# Patient Record
Sex: Female | Born: 1940
Health system: Southern US, Community
[De-identification: ages and names within clinical notes are randomized; demographics above are authoritative.]

## PROBLEM LIST (undated history)

## (undated) DIAGNOSIS — C801 Malignant (primary) neoplasm, unspecified: Secondary | ICD-10-CM

## (undated) DIAGNOSIS — E785 Hyperlipidemia, unspecified: Secondary | ICD-10-CM

## (undated) DIAGNOSIS — J45909 Unspecified asthma, uncomplicated: Secondary | ICD-10-CM

## (undated) DIAGNOSIS — H3581 Retinal edema: Secondary | ICD-10-CM

## (undated) DIAGNOSIS — K649 Unspecified hemorrhoids: Secondary | ICD-10-CM

## (undated) DIAGNOSIS — C50919 Malignant neoplasm of unspecified site of unspecified female breast: Secondary | ICD-10-CM

## (undated) DIAGNOSIS — M1990 Unspecified osteoarthritis, unspecified site: Secondary | ICD-10-CM

## (undated) DIAGNOSIS — R0600 Dyspnea, unspecified: Secondary | ICD-10-CM

## (undated) DIAGNOSIS — I2510 Atherosclerotic heart disease of native coronary artery without angina pectoris: Secondary | ICD-10-CM

## (undated) DIAGNOSIS — I10 Essential (primary) hypertension: Secondary | ICD-10-CM

## (undated) DIAGNOSIS — IMO0001 Reserved for inherently not codable concepts without codable children: Secondary | ICD-10-CM

## (undated) DIAGNOSIS — Z923 Personal history of irradiation: Secondary | ICD-10-CM

## (undated) HISTORY — PX: OTHER SURGICAL HISTORY: SHX169

## (undated) HISTORY — DX: Malignant (primary) neoplasm, unspecified: C80.1

## (undated) HISTORY — DX: Unspecified asthma, uncomplicated: J45.909

## (undated) HISTORY — DX: Atherosclerotic heart disease of native coronary artery without angina pectoris: I25.10

## (undated) HISTORY — DX: Essential (primary) hypertension: I10

## (undated) HISTORY — PX: BREAST LUMPECTOMY: SHX2

## (undated) HISTORY — DX: Unspecified hemorrhoids: K64.9

## (undated) HISTORY — PX: TONSILLECTOMY AND ADENOIDECTOMY: SUR1326

## (undated) HISTORY — DX: Hyperlipidemia, unspecified: E78.5

---

## 2000-01-20 ENCOUNTER — Encounter: Admission: RE | Admit: 2000-01-20 | Discharge: 2000-01-20 | Payer: Self-pay | Admitting: *Deleted

## 2000-01-20 ENCOUNTER — Encounter: Payer: Self-pay | Admitting: *Deleted

## 2000-12-13 ENCOUNTER — Encounter: Admission: RE | Admit: 2000-12-13 | Discharge: 2000-12-13 | Payer: Self-pay | Admitting: *Deleted

## 2000-12-13 ENCOUNTER — Encounter: Payer: Self-pay | Admitting: *Deleted

## 2001-03-22 ENCOUNTER — Other Ambulatory Visit: Admission: RE | Admit: 2001-03-22 | Discharge: 2001-03-22 | Payer: Self-pay | Admitting: *Deleted

## 2001-03-23 ENCOUNTER — Encounter: Payer: Self-pay | Admitting: *Deleted

## 2001-03-23 ENCOUNTER — Encounter: Admission: RE | Admit: 2001-03-23 | Discharge: 2001-03-23 | Payer: Self-pay | Admitting: *Deleted

## 2001-07-24 ENCOUNTER — Encounter: Payer: Self-pay | Admitting: *Deleted

## 2001-07-24 ENCOUNTER — Encounter: Admission: RE | Admit: 2001-07-24 | Discharge: 2001-07-24 | Payer: Self-pay | Admitting: *Deleted

## 2002-12-31 ENCOUNTER — Encounter: Admission: RE | Admit: 2002-12-31 | Discharge: 2002-12-31 | Payer: Self-pay

## 2003-05-01 ENCOUNTER — Encounter: Admission: RE | Admit: 2003-05-01 | Discharge: 2003-05-01 | Payer: Self-pay

## 2003-10-15 ENCOUNTER — Other Ambulatory Visit: Admission: RE | Admit: 2003-10-15 | Discharge: 2003-10-15 | Payer: Self-pay | Admitting: Family Medicine

## 2003-10-30 ENCOUNTER — Other Ambulatory Visit: Admission: RE | Admit: 2003-10-30 | Discharge: 2003-10-30 | Payer: Self-pay | Admitting: Family Medicine

## 2004-01-15 ENCOUNTER — Ambulatory Visit (HOSPITAL_COMMUNITY): Admission: RE | Admit: 2004-01-15 | Discharge: 2004-01-15 | Payer: Self-pay | Admitting: Gastroenterology

## 2004-05-25 ENCOUNTER — Encounter: Admission: RE | Admit: 2004-05-25 | Discharge: 2004-05-25 | Payer: Self-pay | Admitting: Family Medicine

## 2004-07-21 ENCOUNTER — Encounter: Admission: RE | Admit: 2004-07-21 | Discharge: 2004-07-21 | Payer: Self-pay | Admitting: Family Medicine

## 2005-08-22 ENCOUNTER — Encounter: Admission: RE | Admit: 2005-08-22 | Discharge: 2005-08-22 | Payer: Self-pay | Admitting: Family Medicine

## 2006-09-29 ENCOUNTER — Encounter: Admission: RE | Admit: 2006-09-29 | Discharge: 2006-09-29 | Payer: Self-pay | Admitting: Family Medicine

## 2007-07-26 DIAGNOSIS — C801 Malignant (primary) neoplasm, unspecified: Secondary | ICD-10-CM

## 2007-07-26 HISTORY — DX: Malignant (primary) neoplasm, unspecified: C80.1

## 2007-10-22 ENCOUNTER — Encounter: Admission: RE | Admit: 2007-10-22 | Discharge: 2007-10-22 | Payer: Self-pay | Admitting: Family Medicine

## 2007-11-13 ENCOUNTER — Encounter (INDEPENDENT_AMBULATORY_CARE_PROVIDER_SITE_OTHER): Payer: Self-pay | Admitting: Diagnostic Radiology

## 2007-11-13 ENCOUNTER — Encounter: Admission: RE | Admit: 2007-11-13 | Discharge: 2007-11-13 | Payer: Self-pay | Admitting: Family Medicine

## 2007-11-22 ENCOUNTER — Encounter: Admission: RE | Admit: 2007-11-22 | Discharge: 2007-11-22 | Payer: Self-pay | Admitting: Family Medicine

## 2007-12-06 ENCOUNTER — Encounter: Admission: RE | Admit: 2007-12-06 | Discharge: 2007-12-06 | Payer: Self-pay | Admitting: General Surgery

## 2007-12-14 ENCOUNTER — Encounter: Admission: RE | Admit: 2007-12-14 | Discharge: 2007-12-14 | Payer: Self-pay | Admitting: General Surgery

## 2007-12-18 ENCOUNTER — Encounter (INDEPENDENT_AMBULATORY_CARE_PROVIDER_SITE_OTHER): Payer: Self-pay | Admitting: General Surgery

## 2007-12-18 ENCOUNTER — Encounter: Admission: RE | Admit: 2007-12-18 | Discharge: 2007-12-18 | Payer: Self-pay | Admitting: General Surgery

## 2007-12-18 ENCOUNTER — Ambulatory Visit (HOSPITAL_BASED_OUTPATIENT_CLINIC_OR_DEPARTMENT_OTHER): Admission: RE | Admit: 2007-12-18 | Discharge: 2007-12-18 | Payer: Self-pay | Admitting: General Surgery

## 2007-12-20 ENCOUNTER — Ambulatory Visit: Payer: Self-pay | Admitting: Oncology

## 2007-12-26 ENCOUNTER — Ambulatory Visit: Admission: RE | Admit: 2007-12-26 | Discharge: 2008-03-25 | Payer: Self-pay | Admitting: Radiation Oncology

## 2008-02-04 ENCOUNTER — Encounter: Admission: RE | Admit: 2008-02-04 | Discharge: 2008-02-04 | Payer: Self-pay | Admitting: Radiation Oncology

## 2008-03-26 ENCOUNTER — Ambulatory Visit: Admission: RE | Admit: 2008-03-26 | Discharge: 2008-04-14 | Payer: Self-pay | Admitting: Radiation Oncology

## 2008-07-28 IMAGING — MG MM BREAST WIRE LOCALIZATION*L*
4 series · 4 of 4 positions shown · non-contrast
Comparison: none

MAMMO BREAST SURGICAL SPECIMEN

BREAST WIRE LOCALIZATION *L*
LEFT NEEDLE LOCALIZATION AND SPECIMEN RADIOGRAPH (DAY [HOSPITAL]):
CLINICAL DATA: The patient presents for needle localization prior to excision of a recently 
diagnosed carcinoma in the left breast.

[L LM (1 of 2)]
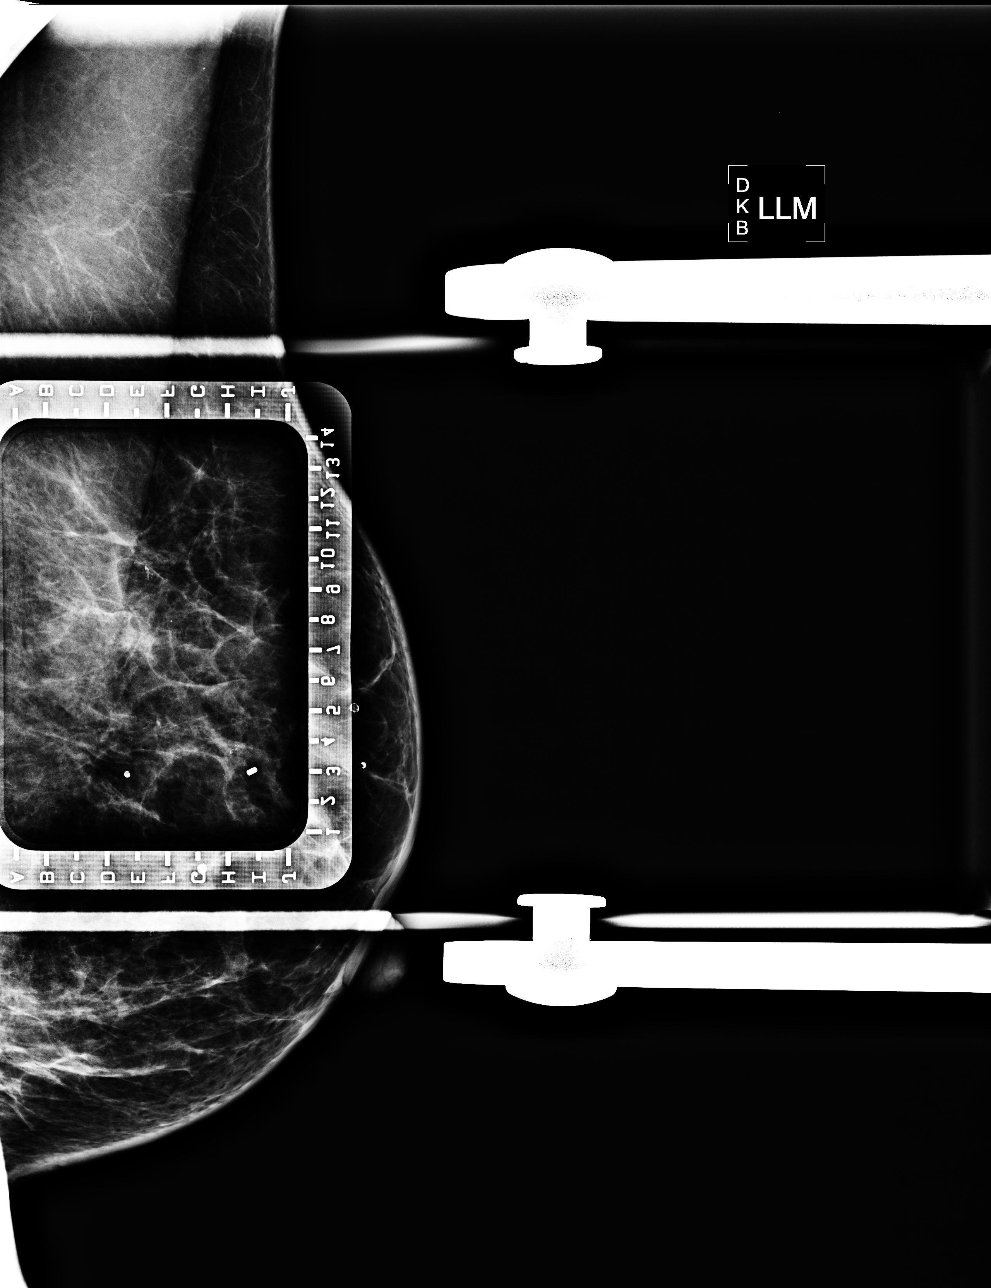

[L LM (2 of 2)]
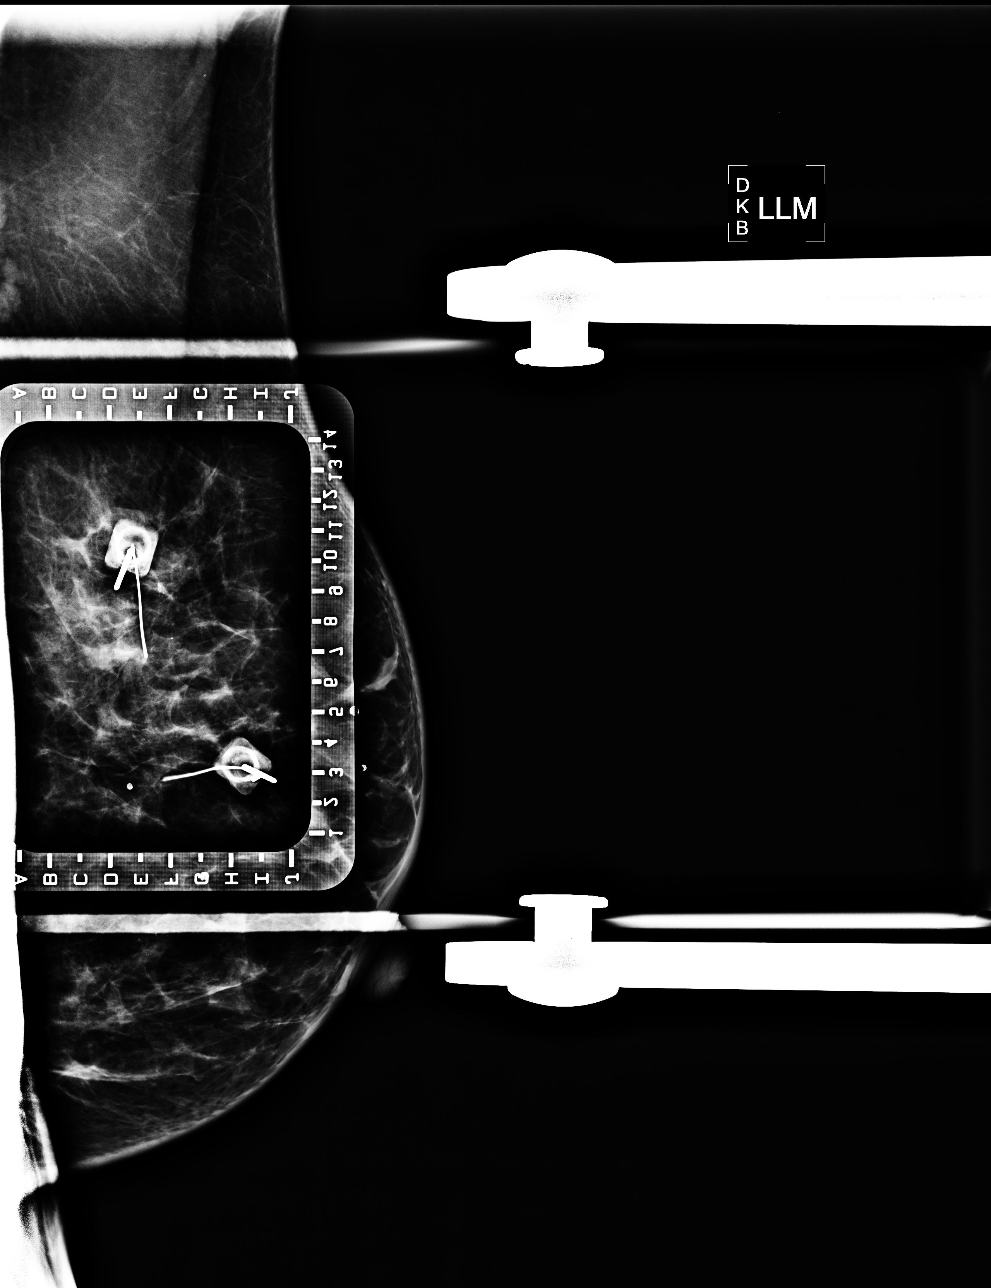

[L CC (1 of 2)]
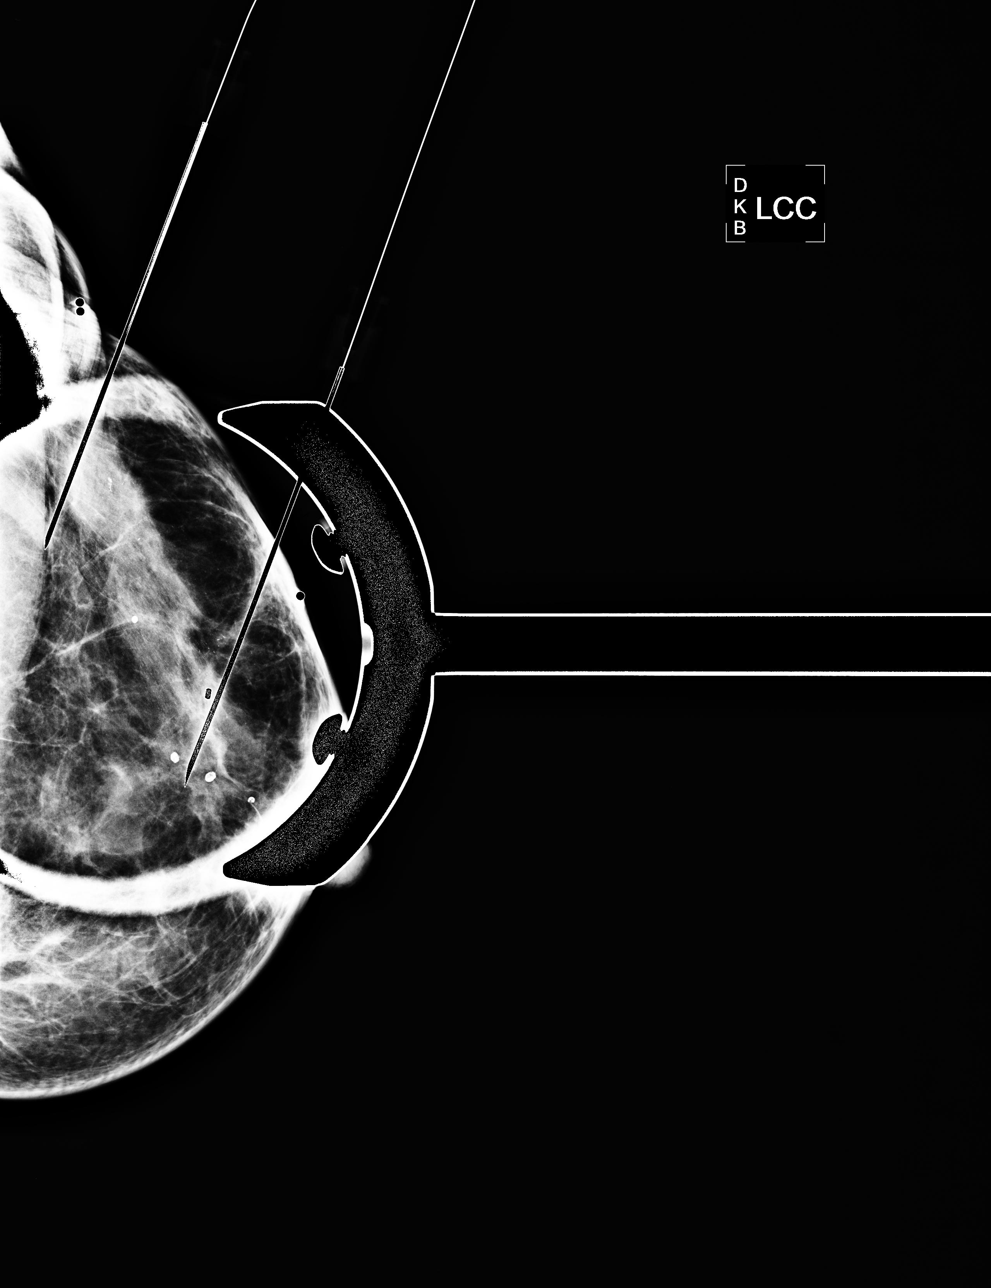

[L CC (2 of 2)]
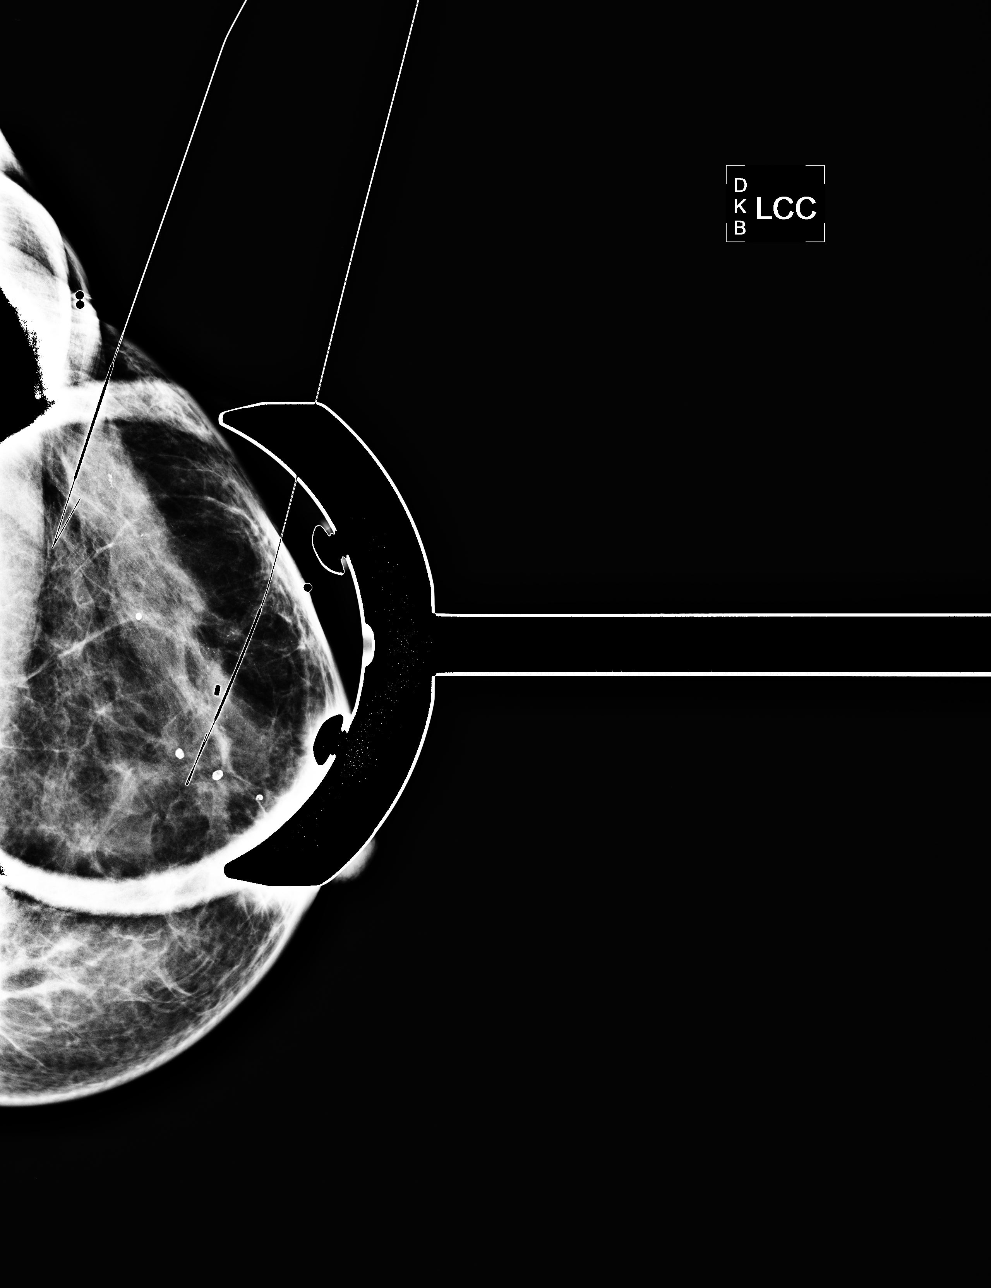

[4 of 4 positions shown; findings below may reference images not displayed]

The procedure for needle localization was discussed with the patient along with its risks, 
benefits, alternatives, and possible outcomes.  The need for rebiopsy should results prove 
inconclusive or precancerous was discussed.  The patient gave written informed consent for the 
procedure.

The patient's left breast was placed within the grad localization device in a true lateral and 
medial projection.  Using sterile technique and 2% Xylocaine for local anesthesia, two 7 cm 
modified Kopans needles were placed in the left upper outer quadrant, one at the anterior margin 
and one at the posterior margin.  Orthogonal views demonstrate appropriate positioning of the 
needle-wire assembly.  The wires were extruded and the needles removed without difficulty.  Films 
were labeled and sent with the patient to surgery.  She tolerated the procedure well.

The specimen was obtained and radiographed by the Day [HOSPITAL] staff.  The clip and both 
wires are within the specimen.  The calcifications of concern are along one border of the specimen.
In particular, one cluster is adjacent to the margin of the specimen.  This was discussed with 
the surgeon in the operating room.  The location of the clip and calcifications was marked by the 
nurse in the operating room.
IMPRESSION: Localization and excision of calcifications and a clip marking a prior biopsy site from the left 
upper outer quadrant.

,

## 2008-10-28 ENCOUNTER — Encounter: Admission: RE | Admit: 2008-10-28 | Discharge: 2008-10-28 | Payer: Self-pay | Admitting: General Surgery

## 2009-01-09 ENCOUNTER — Ambulatory Visit: Payer: Self-pay | Admitting: Oncology

## 2009-06-26 ENCOUNTER — Ambulatory Visit: Payer: Self-pay | Admitting: Oncology

## 2009-10-29 ENCOUNTER — Encounter: Admission: RE | Admit: 2009-10-29 | Discharge: 2009-10-29 | Payer: Self-pay | Admitting: Family Medicine

## 2010-02-24 ENCOUNTER — Encounter: Admission: RE | Admit: 2010-02-24 | Discharge: 2010-02-24 | Payer: Self-pay | Admitting: General Surgery

## 2010-07-01 ENCOUNTER — Ambulatory Visit: Payer: Self-pay | Admitting: Oncology

## 2010-07-01 DIAGNOSIS — C50011 Malignant neoplasm of nipple and areola, right female breast: Secondary | ICD-10-CM

## 2010-08-14 ENCOUNTER — Other Ambulatory Visit: Payer: Self-pay | Admitting: Oncology

## 2010-08-14 DIAGNOSIS — Z1231 Encounter for screening mammogram for malignant neoplasm of breast: Secondary | ICD-10-CM

## 2010-08-15 ENCOUNTER — Encounter: Payer: Self-pay | Admitting: General Surgery

## 2010-11-01 ENCOUNTER — Ambulatory Visit
Admission: RE | Admit: 2010-11-01 | Discharge: 2010-11-01 | Disposition: A | Discharge status: Home / Self Care | Payer: Medicare Other | Source: Ambulatory Visit | Attending: Oncology | Admitting: Oncology

## 2010-11-01 DIAGNOSIS — Z1231 Encounter for screening mammogram for malignant neoplasm of breast: Secondary | ICD-10-CM

## 2010-12-07 NOTE — Op Note (Signed)
NAME:  Tammy Holland, Tammy Holland               ACCOUNT NO.:  [id number]   MEDICAL RECORD NO.:  [id number]          PATIENT TYPE:  AMB   LOCATION:  DSC                          FACILITY:  [facility]   PHYSICIAN:  Angelia Mould. Derrell Lolling, M.D.DATE OF BIRTH:  10/19/1940   DATE OF PROCEDURE:  12/18/2007  DATE OF DISCHARGE:                               OPERATIVE REPORT   PREOPERATIVE DIAGNOSIS:  Ductal carcinoma in situ, left breast, question  of microinvasion.   POSTOPERATIVE DIAGNOSIS:  Ductal carcinoma in situ, left breast,  question of microinvasion.   OPERATION PERFORMED:  1. Injected blue dye, left breast.  2. Left partial mastectomy with needle localization, specimen,      mammogram.  3. Left axillary sentinel node biopsy.   SURGEON:  Angelia Mould. Derrell Lolling, MD   OPERATIVE INDICATIONS:  This is a 70 year old white female who has never  had a breast problem in the past.  Recent imaging study showed some  focal calcifications in the left breast in the upper outer quadrant.  Image-guided biopsy showed ductal carcinoma in situ with a question of a  focus of invasion.  Hormone receptors were reportedly negative.  Her MRI  suggested a second area of enhancement in the left upper outer quadrant  or axillary tail which required followup ultrasound but there was no  sonographic finding that was felt to be low risk.  The patient desired  breast conservation.  She underwent needle localization this morning  with 2 wires bracketing the area of microcalcifications.  She was  brought to [facility] electively.   OPERATIVE TECHNIQUE:  The patient underwent bracketed wire localization  of microcalcifications by Dr. Cain Saupe at the [facility]  [place].  She was brought to [facility].  Nuclear  medicine technician injected technetium sulfur colloid in the  retroareolar area.  The patient was brought to the operating room.  Following the alcohol prep, I injected 5 mL of blue dye in the  left  retroareolar area.  This was 2 mL of methylene blue mixed with 3 mL of  saline.  The breast was massaged for 4 minutes.  The left chest wall,  breast, and left axilla were then prepped and draped in sterile fashion.  Intravenous antibiotics were given.  The patient was identified as to  correct patient, correct procedure, and correct site.   Marcaine 0.5% with epinephrine was used as local infiltration  anesthetic.  The 2 wires were seen in the upper outer quadrant of the  left breast, one inserted medially and one inserted laterally but both  were directed medially and slightly inferiorly toward the retroareolar  area.  I made a curved incision in between the 2 wires that in general,  parallel to the areola.  I dissected around both wires very thoroughly  all the way down to the pectoralis fascia.  The patient's breasts were  actually quite small and so very quickly we got down to the pectoralis  fascia, which was dissected fre with the specimen.  I marked the  specimen with ink markers.  Specimen mammogram was obtained.  Dr. Deboraha Sprang  and I looked at the films and thought there might be a more peripherally  located microcalcification at the anterior-superior margin.  I went back  to the breast and re-excised the anterior and superior margin and  labeled that with ink markers as well and discussed this with Dr. Jimmy Picket.  Hemostasis was excellent and achieved with electrocautery.  The wound was irrigated with saline.  The subcutaneous tissue of the  breast was closed with interrupted sutures of 3-0 Vicryl and the skin  closed with running subcuticular suture of 4-0 Monocryl.   I then directed my attention to the left axilla.  There was a clearly  hot area using the NeoProbe.  A transverse incision was made just above  the hairline.  Dissection was carried down into the axilla.  There were  no palpable abnormalities.  I found a very hot and very blue lymph node  and I took that  out and imprint of cytology on that was completely  negative.  I found another lymph node that was somewhat warm but did not  have any blue dye.  I sent that as well.  Dr. Jimmy Picket said he  examined that as well and felt that it might be a rare atypical cell but  nothing he could call cancer.  Given the fact that this was mostly  ductal carcinoma in situ and it was uncertain, I felt that no further  axillary dissection should be performed at this time.  Dr. Luisa Hart  agreed. The axillary wound was irrigated with saline.  Hemostasis was  excellent and achieved with electrocautery.  The subcutaneous tissue was  closed with interrupted sutures of 3-0 Vicryl and skin closed with  running subcuticular suture of 4-0 Monocryl and Steri-Strips.  Clean  bandages were placed and the patient taken to recovery room in stable  condition.  Estimated blood loss was about 20 mL.  Complications none.  Sponge, needle, and instrument counts were correct.      Angelia Mould. Derrell Lolling, M.D.  Electronically Signed     HMI/MEDQ  D:  12/18/2007  T:  12/19/2007  Job:  [id number]   cc:   Valentino Hue. Magrinat, M.D.  Talmadge Coventry, M.D.

## 2010-12-10 NOTE — Op Note (Signed)
NAME:  Tammy Holland, Tammy Holland                         ACCOUNT NO.:  [id number]   MEDICAL RECORD NO.:  [id number]                   PATIENT TYPE:  AMB   LOCATION:  ENDO                                 FACILITY:  [facility]   PHYSICIAN:  John C. Madilyn Fireman, M.D.                 DATE OF BIRTH:  [birth year]   DATE OF PROCEDURE:  01/15/2004  DATE OF DISCHARGE:                                 OPERATIVE REPORT   PROCEDURE:  Colonoscopy.   INDICATIONS FOR PROCEDURE:  Average risk colon screening in a 70 year old  patient with no recent colon screening.   DESCRIPTION OF PROCEDURE:  The patient was placed in the left lateral  decubitus position then placed on the pulse monitor with continuous low flow  oxygen delivered by nasal cannula. She was sedated with 62.5 mcg IV fentanyl  and 7 mg IV Versed. The Olympus video colonoscope was inserted into the  rectum and advanced to the cecum, confirmed by transillumination at  McBurney's point and visualization of the ileocecal valve and appendiceal  orifice. The prep was excellent. The cecum, ascending, transverse,  descending and sigmoid colon all appeared normal with no masses, polyps,  diverticula or other mucosal abnormalities. The rectum likewise appeared  normal and retroflexed view of the anus revealed no obvious internal  hemorrhoids. The scope was then withdrawn and the patient returned to the  recovery room in stable condition. She tolerated the procedure well and  there were no immediate complications.   IMPRESSION:  Normal colonoscopy.   PLAN:  Next colonoscopy by sigmoidoscopy in five years.                                               John C. Madilyn Fireman, M.D.    JCH/MEDQ  D:  01/15/2004  T:  01/15/2004  Job:  [id number]   cc:   Sharlot Gowda, M.D.  [address]  [place], [place] [address]  Fax: [phone]

## 2010-12-14 ENCOUNTER — Encounter (INDEPENDENT_AMBULATORY_CARE_PROVIDER_SITE_OTHER): Payer: Self-pay | Admitting: General Surgery

## 2011-02-15 ENCOUNTER — Encounter (INDEPENDENT_AMBULATORY_CARE_PROVIDER_SITE_OTHER): Payer: Self-pay | Admitting: General Surgery

## 2011-02-15 ENCOUNTER — Ambulatory Visit (INDEPENDENT_AMBULATORY_CARE_PROVIDER_SITE_OTHER): Payer: Medicare Other | Admitting: General Surgery

## 2011-02-15 DIAGNOSIS — C50919 Malignant neoplasm of unspecified site of unspecified female breast: Secondary | ICD-10-CM

## 2011-02-15 DIAGNOSIS — C50912 Malignant neoplasm of unspecified site of left female breast: Secondary | ICD-10-CM

## 2011-02-15 NOTE — Progress Notes (Signed)
Subjective:     Patient ID: Tammy Holland, female   DOB: 06/03/1941, 70 y.o.   MRN: [id number]  HPI Patient is doing well. Recall that she underwent left partial mastectomy on Dec 18, 2007. Pathology report is ductal carcinoma in situ, receptor negative, node negative. She has no known recurrence today.  Bilateral mammograms were performed November 01, 2010 and they are negative. No focal abnormality. Birads category 2.  Patient has no complaints about her breast.  The only new medical issue is that she has been started on hydrochlorothiazide for hypertension.  Past Medical History  Diagnosis Date  . Cancer     BREAST  . Hearing loss     Current Outpatient Prescriptions  Medication Sig Dispense Refill  . aspirin 81 MG tablet Take 81 mg by mouth daily. Patient also takes blood pressure medication, unsure of name and dosage at this time. Obtain on next appt.      Marland Kitchen CALCIUM PO Take by mouth as needed.       . Glucosamine-Chondroitin (GLUCOSAMINE CHONDR COMPLEX PO) Take by mouth.        . Homeopathic Products (ALLERGY MEDICINE PO) Take by mouth. ALLEGRA, ZYRTEC...STORE BRAND       . LUTEIN PO Take by mouth.        . Multiple Vitamin (MULTIVITAMIN) capsule Take 1 capsule by mouth daily.        . Multiple Vitamins-Minerals (PRESERVISION AREDS PO) Take by mouth.        . Omega-3 Fatty Acids (FISH OIL CONCENTRATE PO) Take by mouth.          No Known Allergies  Family History  Problem Relation Age of Onset  . Stroke Maternal Grandmother   . Cancer Father     bladder  . Hypertension Sister   . Cancer Brother     prostate, skin  . Alcohol abuse Brother   . Depression Brother     History  Substance Use Topics  . Smoking status: Former Games developer  . Smokeless tobacco: Not on file  . Alcohol Use: 1.2 oz/week    2 Glasses of wine per week     per day.      Review of Systems 10 system review of systems is performed and is negative except as described above.    Objective:   Physical Exam Patient looks well. She is in no distress.  Neck reveals no adenopathy, no mass, and no jugular venous distention.  Breast-bilateral breast exam reveals both breasts are small, atrophic, and there is no palpable mass no skin change no nipple discharge no axillary adenopathy. Well-healed scar in the upper left breast.    Assessment:     Ductal carcinoma in situ left breast, who resect her native common mid negative. No evidence of recurrence 3 years following left partial mastectomy and radiation therapy     Plan:     Return to see me in one year after you that your annual mammograms.  Next year we will discuss once again whether you can simply be followed for breast cancer by a single physician from here on out.

## 2011-02-15 NOTE — Patient Instructions (Signed)
I will see you in one year, actually get your annual mammograms. Please discuss with Dr. Darnelle Catalan and your primary care physician as to whether we should just have one physician follow you. for breast cancer in the future.

## 2011-04-20 LAB — CBC
HCT: 37.8
Hemoglobin: 13.1
MCHC: 34.6
MCV: 96.8
Platelets: 204
RBC: 3.91
RDW: 12.6
WBC: 5.8

## 2011-04-20 LAB — COMPREHENSIVE METABOLIC PANEL
ALT: 25
AST: 29
Albumin: 4.1
Alkaline Phosphatase: 40
BUN: 18
CO2: 29
Calcium: 9.4
Chloride: 104
Creatinine, Ser: 0.84
GFR calc Af Amer: >60
GFR calc non Af Amer: >60
Glucose, Bld: 103 — ABNORMAL HIGH
Potassium: 5.1
Sodium: 139
Total Bilirubin: 0.6
Total Protein: 6.5

## 2011-04-20 LAB — DIFFERENTIAL
Basophils Absolute: 0
Basophils Relative: 1
Eosinophils Absolute: 0
Eosinophils Relative: 1
Lymphocytes Relative: 18
Lymphs Abs: 1
Monocytes Absolute: 0.5
Monocytes Relative: 8
Neutro Abs: 4.2
Neutrophils Relative %: 73

## 2011-06-28 ENCOUNTER — Ambulatory Visit (HOSPITAL_BASED_OUTPATIENT_CLINIC_OR_DEPARTMENT_OTHER): Payer: Medicare Other | Admitting: Oncology

## 2011-06-28 VITALS — BP 137/85 | HR 65 | Temp 97.6°F | Ht 64.0 in | Wt 121.9 lb

## 2011-06-28 DIAGNOSIS — C50919 Malignant neoplasm of unspecified site of unspecified female breast: Secondary | ICD-10-CM

## 2011-06-28 DIAGNOSIS — M25569 Pain in unspecified knee: Secondary | ICD-10-CM

## 2011-06-28 DIAGNOSIS — D0590 Unspecified type of carcinoma in situ of unspecified breast: Secondary | ICD-10-CM

## 2011-06-28 DIAGNOSIS — Z171 Estrogen receptor negative status [ER-]: Secondary | ICD-10-CM

## 2011-06-28 NOTE — Progress Notes (Signed)
ID: Tammy Holland   Interval History: Tammy Holland returns today for followup of her breast cancer. The interval history is significant for her son-in-law having died at the age of 39, apparently from a cardiac sudden death. That had a difficult year as a result. In addition the pipes in her kitchen burst and the only good thing this and she ended up with a new kitchen which she is moderately excited about.  ROS: A detailed review of systems was otherwise essentially negative. She continues to have knee pain which is not a new or worsening problem. She feels a little bit short of breath sometimes particularly when walking up stairs without been no palpitations chest pain or pressure no pleurisy no cough no phlegm production and no hemoptysis. She has occasional discomfort in her lumpectomy site. This also is not new and of course it does not represent evidence of disease recurrence.   Medications: I have reviewed the patient's current medications.  Current Outpatient Prescriptions  Medication Sig Dispense Refill  . aspirin 81 MG tablet Take 81 mg by mouth daily. Patient also takes blood pressure medication, unsure of name and dosage at this time. Obtain on next appt.      Marland Kitchen CALCIUM PO Take by mouth as needed.       . Glucosamine-Chondroitin (GLUCOSAMINE CHONDR COMPLEX PO) Take by mouth.        . Homeopathic Products (ALLERGY MEDICINE PO) Take by mouth. ALLEGRA, ZYRTEC...STORE BRAND       . hydrochlorothiazide (MICROZIDE) 12.5 MG capsule       . LUTEIN PO Take by mouth.        . Multiple Vitamin (MULTIVITAMIN) capsule Take 1 capsule by mouth daily.        . Multiple Vitamins-Minerals (PRESERVISION AREDS PO) Take by mouth.        . Omega-3 Fatty Acids (FISH OIL CONCENTRATE PO) Take by mouth.           Objective:  Filed Vitals:   06/28/11 1408  BP: 137/85  Pulse: 65  Temp: 97.6 F (36.4 C)     Physical Exam:    Sclerae unicteric  Oropharynx clear  No peripheral adenopathy  Lungs clear  -- no rales or rhonchi  Heart regular rate and rhythm  Abdomen benign  MSK no focal spinal tenderness, no peripheral edema  Neuro nonfocal  Breast exam: Right breast no suspicious findings; left breast status post lumpectomy no evidence of local recurrence   Lab Results:  CMP      Component Value Date/Time   NA 139 12/14/2007 1430   K 5.1 12/14/2007 1430   CL 104 12/14/2007 1430   CO2 29 12/14/2007 1430   GLUCOSE 103* 12/14/2007 1430   BUN 18 12/14/2007 1430   CREATININE 0.84 12/14/2007 1430   CALCIUM 9.4 12/14/2007 1430   PROT 6.5 12/14/2007 1430   ALBUMIN 4.1 12/14/2007 1430   AST 29 12/14/2007 1430   ALT 25 12/14/2007 1430   ALKPHOS 40 12/14/2007 1430   BILITOT 0.6 12/14/2007 1430   GFRNONAA >60 12/14/2007 1430   GFRAA  Value: >60        The eGFR has been calculated using the MDRD equation. This calculation has not been validated in all clinical 12/14/2007 1430    CBC Lab Results  Component Value Date   WBC 5.8 12/14/2007   HGB 13.1 12/14/2007   HCT 37.8 12/14/2007   MCV 96.8 12/14/2007   PLT 204 12/14/2007    Studies/Results:  mammography April of 2012 was unremarkable.   Assessment: 70 year old [place] woman status post left lumpectomy and sentinel lymph node sampling May of 2009 for ductal carcinoma in situ, grade 3, measuring 8 mm, with ample margins, and 0 of 2 sentinel lymph nodes involved, estrogen and progesterone receptor negative, status post radiation completed September of 2009.   Plan:  She understands she has a stage 0 breast cancer with an excellent prognosis. She will see [place] again in May after her next mammogram and then we'll see m she knows to call for any problems that may develop before that visit. e one last time in May of 2014.  MAGRINAT,GUSTAV C 06/28/2011

## 2011-08-17 DIAGNOSIS — Z780 Asymptomatic menopausal state: Secondary | ICD-10-CM | POA: Diagnosis not present

## 2011-08-17 DIAGNOSIS — I10 Essential (primary) hypertension: Secondary | ICD-10-CM | POA: Diagnosis not present

## 2011-09-08 DIAGNOSIS — H353190 Nonexudative age-related macular degeneration, unspecified eye, stage unspecified: Secondary | ICD-10-CM | POA: Diagnosis not present

## 2011-10-11 ENCOUNTER — Other Ambulatory Visit (INDEPENDENT_AMBULATORY_CARE_PROVIDER_SITE_OTHER): Payer: Self-pay | Admitting: General Surgery

## 2011-10-11 DIAGNOSIS — Z853 Personal history of malignant neoplasm of breast: Secondary | ICD-10-CM

## 2011-10-19 DIAGNOSIS — Z780 Asymptomatic menopausal state: Secondary | ICD-10-CM | POA: Diagnosis not present

## 2011-11-03 ENCOUNTER — Ambulatory Visit
Admission: RE | Admit: 2011-11-03 | Discharge: 2011-11-03 | Disposition: A | Discharge status: Home / Self Care | Payer: Medicare Other | Source: Ambulatory Visit | Attending: General Surgery | Admitting: General Surgery

## 2011-11-03 DIAGNOSIS — N6489 Other specified disorders of breast: Secondary | ICD-10-CM | POA: Diagnosis not present

## 2011-11-03 DIAGNOSIS — Z853 Personal history of malignant neoplasm of breast: Secondary | ICD-10-CM

## 2011-12-05 ENCOUNTER — Ambulatory Visit (HOSPITAL_BASED_OUTPATIENT_CLINIC_OR_DEPARTMENT_OTHER): Payer: Medicare Other | Admitting: Physician Assistant

## 2011-12-05 ENCOUNTER — Telehealth: Payer: Self-pay | Admitting: *Deleted

## 2011-12-05 ENCOUNTER — Encounter: Payer: Self-pay | Admitting: Physician Assistant

## 2011-12-05 VITALS — BP 145/71 | HR 69 | Temp 97.6°F | Ht 64.0 in | Wt 125.1 lb

## 2011-12-05 DIAGNOSIS — C50919 Malignant neoplasm of unspecified site of unspecified female breast: Secondary | ICD-10-CM

## 2011-12-05 DIAGNOSIS — Z171 Estrogen receptor negative status [ER-]: Secondary | ICD-10-CM | POA: Diagnosis not present

## 2011-12-05 DIAGNOSIS — Z853 Personal history of malignant neoplasm of breast: Secondary | ICD-10-CM

## 2011-12-05 DIAGNOSIS — D0590 Unspecified type of carcinoma in situ of unspecified breast: Secondary | ICD-10-CM

## 2011-12-05 NOTE — Telephone Encounter (Signed)
GAVE PATIENT APPOINTMENT FOR 2014 AT THE [facility] PRINTED OU T CALENDAR AND GAVE TO THE PATIENT 

## 2011-12-05 NOTE — Progress Notes (Signed)
ID: Tammy Holland   DOB: 28-Nov-1940  MR#: [id number]  WGN#:[id number]  HISTORY OF PRESENT ILLNESS: She had a bilateral screening mammography on October 22, 2007, which showed new calcifications in the left breast.  Additional views on November 13, 2007, showed linear and coarse calcifications in the left upper outer quadrant, which were felt to be suspicious for ductal carcinoma in situ.  Stereotactic biopsy was obtained the same day and showed ([id number]) and in situ ductal carcinoma associated with a small focus suspicious for possible invasion.  The tumor was ER poor at 2% and PR negative at 0%.  With this information the patient was referred to Dr. Derrell Lolling and bilateral breast MRIs were obtained November 22, 2007.  This showed the biopsy cavity in the left upper outer quadrant and a second area of enhancement in the left upper outer quadrant or axillary tail suggestive of asymmetry.  Accordingly a second-look ultrasound was obtained.  This showed normal-appearing fibroglandular tissue and normal appearing axillary lymph nodes.  Accordingly on May 26, the patient proceeded to left lumpectomy and sentinel lymph node biopsy under Dr. Derrell Lolling.  The final pathology [id number]) confirmed an 8 mm in situ ductal carcinoma, high grade, with no evidence of lymphovascular invasion and 0 of 2 sentinel lymph nodes involved.  The margins were ample.  A prognostic panel was repeated and this time was convincingly negative for ER at 0% as well as 0% PR.  With this information the patient was referred to Dr. Dayton Scrape.  She completed radiation therapy in September 2009. She is followed with observation alone at this time.  INTERVAL HISTORY: Shamera returns today for routine followup of her left breast carcinoma. Interval history is unremarkable, and the [place] is doing well. She stays busy, enjoys gardening, exercises a lot, and really has few complaints today.  REVIEW OF SYSTEMS: Renatta has had no recent illnesses and  denies fevers, chills, or night sweats. No significant hot flashes. No chronic nausea or change in bowel habits. No chest pain, cough, or phlegm production. She has mild shortness of breath with exertion which is not new. No abnormal headaches or dizziness. She has arthritis in her knees, but denies any additional pain elsewhere.  Otherwise a detailed review of systems is unremarkable.   PAST MEDICAL HISTORY: Past Medical History  Diagnosis Date  . Cancer     BREAST  . Hearing loss     PAST SURGICAL HISTORY: Past Surgical History  Procedure Date  . Mastectomy, partial 5/09  . Tonsillectomy and adenoidectomy 11/2007    FAMILY HISTORY Family History  Problem Relation Age of Onset  . Stroke Maternal Grandmother   . Cancer Father     bladder  . Hypertension Sister   . Cancer Brother     prostate, skin  . Alcohol abuse Brother   . Depression Brother   There is no breast cancer or ovarian cancer in the immediate family.  GYNECOLOGIC HISTORY: She is Gx, P2, first pregnancy to term age 44.  Last menstrual period more than five years ago.  She took hormone replacement for many years, she thinks more than five stopping about three years ago.    SOCIAL HISTORY: She is a retired Haematologist.  Her husband of 44 years, [name], is retired from the [employer].  They have a daughter, [name], in [place], 36 years old with three children and a daughter, [name], with bipolar disorder and mild MR.     ADVANCED DIRECTIVES:  HEALTH MAINTENANCE:  History  Substance Use Topics  . Smoking status: Former Smoker    Quit date: 07/25/1976  . Smokeless tobacco: Not on file  . Alcohol Use: 1.2 oz/week    2 Glasses of wine per week     per day.     Colonoscopy: <5 yrs ago, Dr. Madilyn Fireman  PAP: 2 years ago  Bone density: <2 years, "normal"  Lipid panel: "good"    No Known Allergies  Current Outpatient Prescriptions  Medication Sig Dispense Refill  . aspirin 325 MG tablet Take 325 mg by mouth  daily.      . Biotin 1000 MCG tablet Take 1,000 mcg by mouth daily.      . Glucosamine-Chondroitin (GLUCOSAMINE CHONDR COMPLEX PO) Take by mouth.        . Homeopathic Products (ALLERGY MEDICINE PO) Take by mouth. ALLEGRA, ZYRTEC...STORE BRAND       . hydrochlorothiazide (MICROZIDE) 12.5 MG capsule       . LUTEIN PO Take by mouth.        . Multiple Vitamin (MULTIVITAMIN) capsule Take 1 capsule by mouth daily.        . Multiple Vitamins-Minerals (PRESERVISION AREDS PO) Take by mouth.        . Omega-3 Fatty Acids (FISH OIL CONCENTRATE PO) Take by mouth.        Marland Kitchen CALCIUM PO Take by mouth as needed.         OBJECTIVE: Filed Vitals:   12/05/11 1535  BP: 145/71  Pulse: 69  Temp: 97.6 F (36.4 C)     Body mass index is 21.47 kg/(m^2).    ECOG FS: 0  Filed Weights   12/05/11 1535  Weight: 125 lb 1.6 oz (56.745 kg)   Physical Exam: HEENT:  Sclerae anicteric, conjunctivae pink.  Oropharynx clear.     Nodes:  No cervical, supraclavicular, or axillary lymphadenopathy palpated.  Breast Exam: Right breast is unremarkable, no masses, skin changes, or nipple inversion. Left breast is status post lumpectomy. No suspicious nodularity or skin changes. No evidence of local recurrence.  Lungs:  Clear to auscultation bilaterally.  No crackles, rhonchi, or wheezes.   Heart:  Regular rate and rhythm.   Abdomen:  Soft, thin, nontender.  Positive bowel sounds.  No organomegaly or masses palpated.   Musculoskeletal:  No focal spinal tenderness to palpation.  Extremities:  Benign.  No peripheral edema or cyanosis.   Skin:  Benign.   Neuro:  Nonfocal. Alert and oriented x3.    LAB RESULTS: Lab Results  Component Value Date   WBC 5.8 12/14/2007   NEUTROABS 4.2 12/14/2007   HGB 13.1 12/14/2007   HCT 37.8 12/14/2007   MCV 96.8 12/14/2007   PLT 204 12/14/2007      Chemistry      Component Value Date/Time   NA 139 12/14/2007 1430   K 5.1 12/14/2007 1430   CL 104 12/14/2007 1430   CO2 29 12/14/2007 1430    BUN 18 12/14/2007 1430   CREATININE 0.84 12/14/2007 1430      Component Value Date/Time   CALCIUM 9.4 12/14/2007 1430   ALKPHOS 40 12/14/2007 1430   AST 29 12/14/2007 1430   ALT 25 12/14/2007 1430   BILITOT 0.6 12/14/2007 1430     No labs were obtained today, 12/05/2011.   STUDIES:  11/03/2011 DIGITAL DIAGNOSTIC BILATERAL MAMMOGRAM WITH CAD  Comparison: multiple prior studies including 11/01/10  Findings: Scattered fibroglandular densities bilaterally. No  suspicious mass or calcifications. Stable post-surgical scar left  upper outer quadrant.  Mammographic images were processed with CAD.  IMPRESSION:  Benign postoperative appearance.  BI-RADS CATEGORY 2: Benign finding(s).  RECOMMENDATION:  Diagnostic bilateral mammogram in one year.   ASSESSMENT: 71 year old [place] woman   (1)  status post left lumpectomy and sentinel lymph node sampling May of 2009 for ductal carcinoma in situ, grade 3, measuring 8 mm, with ample margins, and 0 of 2 sentinel lymph nodes involved, estrogen and progesterone receptor negative   (2)  status post radiation completed September of 2009.  PLAN: With regards to her breast cancer, Kiarra is doing well and there is no clinical evidence of disease recurrence. She will have her next mammogram in April 2014, and we will see her for one last visit in May of 2014, at which time she will complete her 5 year followup through this office.   This was all reviewed with Harriett Sine today. She voices understanding and agreement with this plan, and noticed a call with any changes or problems.  Kristene Liberati    12/05/2011

## 2012-01-05 DIAGNOSIS — H6120 Impacted cerumen, unspecified ear: Secondary | ICD-10-CM | POA: Diagnosis not present

## 2012-02-16 DIAGNOSIS — Z23 Encounter for immunization: Secondary | ICD-10-CM | POA: Diagnosis not present

## 2012-02-16 DIAGNOSIS — Z0000 Encounter for general adult medical examination without abnormal findings: Secondary | ICD-10-CM | POA: Diagnosis not present

## 2012-05-28 DIAGNOSIS — Z23 Encounter for immunization: Secondary | ICD-10-CM | POA: Diagnosis not present

## 2012-05-30 DIAGNOSIS — I10 Essential (primary) hypertension: Secondary | ICD-10-CM | POA: Diagnosis not present

## 2012-05-30 DIAGNOSIS — R6889 Other general symptoms and signs: Secondary | ICD-10-CM | POA: Diagnosis not present

## 2012-06-07 DIAGNOSIS — I10 Essential (primary) hypertension: Secondary | ICD-10-CM | POA: Diagnosis not present

## 2012-06-07 DIAGNOSIS — R0602 Shortness of breath: Secondary | ICD-10-CM | POA: Diagnosis not present

## 2012-06-13 DIAGNOSIS — R0602 Shortness of breath: Secondary | ICD-10-CM | POA: Diagnosis not present

## 2012-06-13 DIAGNOSIS — I10 Essential (primary) hypertension: Secondary | ICD-10-CM | POA: Diagnosis not present

## 2012-08-16 ENCOUNTER — Other Ambulatory Visit (HOSPITAL_COMMUNITY): Payer: Self-pay | Admitting: Internal Medicine

## 2012-08-16 DIAGNOSIS — R0602 Shortness of breath: Secondary | ICD-10-CM | POA: Diagnosis not present

## 2012-08-16 DIAGNOSIS — I10 Essential (primary) hypertension: Secondary | ICD-10-CM | POA: Diagnosis not present

## 2012-08-20 ENCOUNTER — Ambulatory Visit (HOSPITAL_COMMUNITY)
Admission: RE | Admit: 2012-08-20 | Discharge: 2012-08-20 | Disposition: A | Discharge status: Home / Self Care | Payer: Medicare Other | Source: Ambulatory Visit | Attending: Internal Medicine | Admitting: Internal Medicine

## 2012-08-20 DIAGNOSIS — R0602 Shortness of breath: Secondary | ICD-10-CM | POA: Diagnosis not present

## 2012-08-20 MED ORDER — ALBUTEROL SULFATE (5 MG/ML) 0.5% IN NEBU
2.5000 mg | INHALATION_SOLUTION | Freq: Once | RESPIRATORY_TRACT | Status: AC
  Administered 2012-08-20: 2.5 mg via RESPIRATORY_TRACT

## 2012-08-22 ENCOUNTER — Encounter (HOSPITAL_COMMUNITY): Payer: Medicare Other

## 2012-10-05 DIAGNOSIS — J45909 Unspecified asthma, uncomplicated: Secondary | ICD-10-CM | POA: Diagnosis not present

## 2012-11-05 ENCOUNTER — Other Ambulatory Visit (INDEPENDENT_AMBULATORY_CARE_PROVIDER_SITE_OTHER): Payer: Self-pay | Admitting: General Surgery

## 2012-11-05 ENCOUNTER — Ambulatory Visit
Admission: RE | Admit: 2012-11-05 | Discharge: 2012-11-05 | Disposition: A | Discharge status: Home / Self Care | Payer: Medicare Other | Source: Ambulatory Visit | Attending: Physician Assistant | Admitting: Physician Assistant

## 2012-11-05 DIAGNOSIS — Z853 Personal history of malignant neoplasm of breast: Secondary | ICD-10-CM

## 2012-12-07 DIAGNOSIS — J45909 Unspecified asthma, uncomplicated: Secondary | ICD-10-CM | POA: Diagnosis not present

## 2012-12-11 ENCOUNTER — Ambulatory Visit (HOSPITAL_BASED_OUTPATIENT_CLINIC_OR_DEPARTMENT_OTHER): Payer: Medicare Other | Admitting: Oncology

## 2012-12-11 VITALS — BP 130/74 | HR 78 | Temp 97.6°F | Resp 20 | Ht 64.0 in | Wt 129.5 lb

## 2012-12-11 DIAGNOSIS — C50919 Malignant neoplasm of unspecified site of unspecified female breast: Secondary | ICD-10-CM

## 2012-12-11 DIAGNOSIS — C50912 Malignant neoplasm of unspecified site of left female breast: Secondary | ICD-10-CM

## 2012-12-11 NOTE — Progress Notes (Signed)
ID: Tammy Holland   DOB: [birth year]  MR#: [id number]  WUJ#:[id number]  PCP: [name], MD  HISTORY OF PRESENT ILLNESS: She had a bilateral screening mammography on October 22, 2007, which showed new calcifications in the left breast.  Additional views on November 13, 2007, showed linear and coarse calcifications in the left upper outer quadrant, which were felt to be suspicious for ductal carcinoma in situ.  Stereotactic biopsy was obtained the same day and showed ([id number]) and in situ ductal carcinoma associated with a small focus suspicious for possible invasion.  The tumor was ER poor at 2% and PR negative at 0%.  With this information the patient was referred to Dr. Derrell Lolling and bilateral breast MRIs were obtained November 22, 2007.  This showed the biopsy cavity in the left upper outer quadrant and a second area of enhancement in the left upper outer quadrant or axillary tail suggestive of asymmetry.  Accordingly a second-look ultrasound was obtained.  This showed normal-appearing fibroglandular tissue and normal appearing axillary lymph nodes.  Accordingly on May 26, the patient proceeded to left lumpectomy and sentinel lymph node biopsy under Dr. Derrell Lolling.  The final pathology [id number]) confirmed an 8 mm in situ ductal carcinoma, high grade, with no evidence of lymphovascular invasion and 0 of 2 sentinel lymph nodes involved.  The margins were ample.  A prognostic panel was repeated and this time was convincingly negative for ER at 0% as well as 0% PR.  With this information the patient was referred to Dr. Dayton Scrape.  She completed radiation therapy in September 2009. Tammy Holland subsequent history is as detailed below.  INTERVAL HISTORY: Tammy Holland returns today for followup of Tammy Holland breast cancer. The interval history is unremarkable. She goes to the gym 5 times a week, she lost vegetables and has a great diet, and is enjoying watching Tammy Holland grandchildren starting to graduate from high school.  REVIEW OF  SYSTEMS: She is using a hearing aid. She gets a little short of breath when climbing stairs, but gets to the top without stopping. She's been diagnosed with "reactive airway disease,", which was a relief for Tammy Holland since Tammy Holland brother died suddenly from a ruptured aneurysm(his only symptom was mild shortness of breath). Otherwise a detailed review of systems today was entirely negative  PAST MEDICAL HISTORY: Past Medical History  Diagnosis Date  . Cancer     BREAST  . Hearing loss     PAST SURGICAL HISTORY: Past Surgical History  Procedure Laterality Date  . Mastectomy, partial  5/09  . Tonsillectomy and adenoidectomy  11/2007    FAMILY HISTORY Family History  Problem Relation Age of Onset  . Stroke Maternal Grandmother   . Cancer Father     bladder  . Hypertension Sister   . Cancer Brother     prostate, skin  . Alcohol abuse Brother   . Depression Brother   There is no breast cancer or ovarian cancer in the immediate family.  GYNECOLOGIC HISTORY: She is Gx, P2, first pregnancy to term age 72.  Last menstrual period more than five years ago.  She took hormone replacement for many years, she thinks more than five stopping about three years ago.    SOCIAL HISTORY: She is a retired Haematologist.  Tammy Holland husband of 45+ years, [name], is retired from the [employer].  They have a daughter, Tammy Holland, in [place], with three children; and a daughter, Tammy Holland, with bipolar disorder and mild MR.     ADVANCED DIRECTIVES: In place  HEALTH MAINTENANCE: History  Substance Use Topics  . Smoking status: Former Smoker    Quit date: 07/25/1976  . Smokeless tobacco: Not on file  . Alcohol Use: 1.2 oz/week    2 Glasses of wine per week     Comment: per day.     Colonoscopy: <5 yrs ago, Dr. Madilyn Fireman  PAP: 3 years ago  Bone density: <2 years, "normal"  Lipid panel: "good"    No Known Allergies  Current Outpatient Prescriptions  Medication Sig Dispense Refill  . aspirin 325 MG tablet Take 325  mg by mouth daily.      . Biotin 1000 MCG tablet Take 1,000 mcg by mouth daily.      Marland Kitchen CALCIUM PO Take by mouth as needed.       . Glucosamine-Chondroitin (GLUCOSAMINE CHONDR COMPLEX PO) Take by mouth.        . Homeopathic Products (ALLERGY MEDICINE PO) Take by mouth. ALLEGRA, ZYRTEC...STORE BRAND       . hydrochlorothiazide (MICROZIDE) 12.5 MG capsule       . LUTEIN PO Take by mouth.        . Multiple Vitamin (MULTIVITAMIN) capsule Take 1 capsule by mouth daily.        . Multiple Vitamins-Minerals (PRESERVISION AREDS PO) Take by mouth.        . Omega-3 Fatty Acids (FISH OIL CONCENTRATE PO) Take by mouth.         No current facility-administered medications for this visit.    OBJECTIVE: Elderly white woman who appears well Filed Vitals:   12/11/12 1342  BP: 130/74  Pulse: 78  Temp: 97.6 F (36.4 C)  Resp: 20     Body mass index is 22.22 kg/(m^2).    ECOG FS: 0  Filed Weights   12/11/12 1342  Weight: 129 lb 8 oz (58.741 kg)   Sclerae unicteric Oropharynx clear No cervical or supraclavicular adenopathy Lungs no rales or rhonchi Heart regular rate and rhythm Abd benign MSK no focal spinal tenderness, no peripheral edema Neuro: nonfocal, well oriented, pleasant affect Breasts: The right breast is unremarkable. The left breast is status post lumpectomy and radiation. There is no evidence of local recurrence. The left axilla is benign.  LAB RESULTS: Lab Results  Component Value Date   WBC 5.8 12/14/2007   NEUTROABS 4.2 12/14/2007   HGB 13.1 12/14/2007   HCT 37.8 12/14/2007   MCV 96.8 12/14/2007   PLT 204 12/14/2007      Chemistry      Component Value Date/Time   NA 139 12/14/2007 1430   K 5.1 12/14/2007 1430   CL 104 12/14/2007 1430   CO2 29 12/14/2007 1430   BUN 18 12/14/2007 1430   CREATININE 0.84 12/14/2007 1430      Component Value Date/Time   CALCIUM 9.4 12/14/2007 1430   ALKPHOS 40 12/14/2007 1430   AST 29 12/14/2007 1430   ALT 25 12/14/2007 1430   BILITOT 0.6  12/14/2007 1430     STUDIES: Mammography 11/05/2012 was unremarkable  ASSESSMENT: 72 y.o.  [place] woman   (1)  status post left lumpectomy and sentinel lymph node sampling May of 2009 for ductal carcinoma in situ, grade 3, measuring 8 mm, with ample margins, and 0 of 2 sentinel lymph nodes involved, estrogen and progesterone receptor negative   (2)  status post radiation completed September of 2009.  PLAN: Satonya has completed 5 years of followup for Tammy Holland noninvasive breast cancer. She is ready to "graduate" and went through  the ceremony today. She knows that we will be glad to see Tammy Holland anytime in the future for any problems that may be related to Tammy Holland history of breast cancer, but as of now we are making no further appointments for Tammy Holland here.  MAGRINAT,GUSTAV C    12/11/2012

## 2012-12-12 ENCOUNTER — Telehealth: Payer: Self-pay | Admitting: Oncology

## 2012-12-12 NOTE — Telephone Encounter (Signed)
Sent letter to Dr. Clent Ridges office from Dr. Darnelle Catalan

## 2013-01-08 DIAGNOSIS — H524 Presbyopia: Secondary | ICD-10-CM | POA: Diagnosis not present

## 2013-01-08 DIAGNOSIS — H353190 Nonexudative age-related macular degeneration, unspecified eye, stage unspecified: Secondary | ICD-10-CM | POA: Diagnosis not present

## 2013-04-18 DIAGNOSIS — J329 Chronic sinusitis, unspecified: Secondary | ICD-10-CM | POA: Diagnosis not present

## 2013-05-17 DIAGNOSIS — I10 Essential (primary) hypertension: Secondary | ICD-10-CM | POA: Diagnosis not present

## 2013-05-17 DIAGNOSIS — M25469 Effusion, unspecified knee: Secondary | ICD-10-CM | POA: Diagnosis not present

## 2013-05-17 DIAGNOSIS — Z0000 Encounter for general adult medical examination without abnormal findings: Secondary | ICD-10-CM | POA: Diagnosis not present

## 2013-05-17 DIAGNOSIS — Z23 Encounter for immunization: Secondary | ICD-10-CM | POA: Diagnosis not present

## 2013-05-17 DIAGNOSIS — J45909 Unspecified asthma, uncomplicated: Secondary | ICD-10-CM | POA: Diagnosis not present

## 2013-07-21 DIAGNOSIS — J111 Influenza due to unidentified influenza virus with other respiratory manifestations: Secondary | ICD-10-CM | POA: Diagnosis not present

## 2013-08-02 DIAGNOSIS — J069 Acute upper respiratory infection, unspecified: Secondary | ICD-10-CM | POA: Diagnosis not present

## 2013-11-06 ENCOUNTER — Other Ambulatory Visit: Payer: Self-pay | Admitting: Internal Medicine

## 2013-11-06 DIAGNOSIS — Z853 Personal history of malignant neoplasm of breast: Secondary | ICD-10-CM

## 2013-11-19 ENCOUNTER — Ambulatory Visit
Admission: RE | Admit: 2013-11-19 | Discharge: 2013-11-19 | Disposition: A | Discharge status: Home / Self Care | Payer: Medicare Other | Source: Ambulatory Visit | Attending: Internal Medicine | Admitting: Internal Medicine

## 2013-11-19 DIAGNOSIS — Z853 Personal history of malignant neoplasm of breast: Secondary | ICD-10-CM

## 2013-11-19 DIAGNOSIS — R928 Other abnormal and inconclusive findings on diagnostic imaging of breast: Secondary | ICD-10-CM | POA: Diagnosis not present

## 2013-11-20 DIAGNOSIS — I10 Essential (primary) hypertension: Secondary | ICD-10-CM | POA: Diagnosis not present

## 2013-11-20 DIAGNOSIS — J45909 Unspecified asthma, uncomplicated: Secondary | ICD-10-CM | POA: Diagnosis not present

## 2013-11-20 DIAGNOSIS — R6889 Other general symptoms and signs: Secondary | ICD-10-CM | POA: Diagnosis not present

## 2014-03-08 ENCOUNTER — Encounter: Payer: Self-pay | Admitting: *Deleted

## 2014-05-19 DIAGNOSIS — Z23 Encounter for immunization: Secondary | ICD-10-CM | POA: Diagnosis not present

## 2014-05-19 DIAGNOSIS — I10 Essential (primary) hypertension: Secondary | ICD-10-CM | POA: Diagnosis not present

## 2014-05-19 DIAGNOSIS — Z1211 Encounter for screening for malignant neoplasm of colon: Secondary | ICD-10-CM | POA: Diagnosis not present

## 2014-05-19 DIAGNOSIS — Z0000 Encounter for general adult medical examination without abnormal findings: Secondary | ICD-10-CM | POA: Diagnosis not present

## 2014-05-19 DIAGNOSIS — J45909 Unspecified asthma, uncomplicated: Secondary | ICD-10-CM | POA: Diagnosis not present

## 2014-05-19 DIAGNOSIS — J309 Allergic rhinitis, unspecified: Secondary | ICD-10-CM | POA: Diagnosis not present

## 2014-05-19 DIAGNOSIS — Z1389 Encounter for screening for other disorder: Secondary | ICD-10-CM | POA: Diagnosis not present

## 2014-06-16 DIAGNOSIS — Z1211 Encounter for screening for malignant neoplasm of colon: Secondary | ICD-10-CM | POA: Diagnosis not present

## 2014-07-02 DIAGNOSIS — H2513 Age-related nuclear cataract, bilateral: Secondary | ICD-10-CM | POA: Diagnosis not present

## 2014-07-02 DIAGNOSIS — H524 Presbyopia: Secondary | ICD-10-CM | POA: Diagnosis not present

## 2014-07-02 DIAGNOSIS — H5203 Hypermetropia, bilateral: Secondary | ICD-10-CM | POA: Diagnosis not present

## 2014-07-02 DIAGNOSIS — H35363 Drusen (degenerative) of macula, bilateral: Secondary | ICD-10-CM | POA: Diagnosis not present

## 2014-11-11 ENCOUNTER — Other Ambulatory Visit: Payer: Self-pay | Admitting: Internal Medicine

## 2014-11-11 DIAGNOSIS — Z853 Personal history of malignant neoplasm of breast: Secondary | ICD-10-CM

## 2014-11-17 DIAGNOSIS — J4530 Mild persistent asthma, uncomplicated: Secondary | ICD-10-CM | POA: Diagnosis not present

## 2014-11-17 DIAGNOSIS — I10 Essential (primary) hypertension: Secondary | ICD-10-CM | POA: Diagnosis not present

## 2014-11-28 DIAGNOSIS — M1712 Unilateral primary osteoarthritis, left knee: Secondary | ICD-10-CM | POA: Diagnosis not present

## 2014-12-04 ENCOUNTER — Ambulatory Visit
Admission: RE | Admit: 2014-12-04 | Discharge: 2014-12-04 | Disposition: A | Discharge status: Home / Self Care | Payer: Medicare Other | Source: Ambulatory Visit | Attending: Internal Medicine | Admitting: Internal Medicine

## 2014-12-04 DIAGNOSIS — R922 Inconclusive mammogram: Secondary | ICD-10-CM | POA: Diagnosis not present

## 2014-12-04 DIAGNOSIS — Z853 Personal history of malignant neoplasm of breast: Secondary | ICD-10-CM

## 2015-05-22 DIAGNOSIS — Z1389 Encounter for screening for other disorder: Secondary | ICD-10-CM | POA: Diagnosis not present

## 2015-05-22 DIAGNOSIS — Z0000 Encounter for general adult medical examination without abnormal findings: Secondary | ICD-10-CM | POA: Diagnosis not present

## 2015-05-22 DIAGNOSIS — J45909 Unspecified asthma, uncomplicated: Secondary | ICD-10-CM | POA: Diagnosis not present

## 2015-05-22 DIAGNOSIS — Z23 Encounter for immunization: Secondary | ICD-10-CM | POA: Diagnosis not present

## 2015-05-22 DIAGNOSIS — I10 Essential (primary) hypertension: Secondary | ICD-10-CM | POA: Diagnosis not present

## 2015-05-22 DIAGNOSIS — J3489 Other specified disorders of nose and nasal sinuses: Secondary | ICD-10-CM | POA: Diagnosis not present

## 2015-10-19 DIAGNOSIS — H11153 Pinguecula, bilateral: Secondary | ICD-10-CM | POA: Diagnosis not present

## 2015-10-19 DIAGNOSIS — H35363 Drusen (degenerative) of macula, bilateral: Secondary | ICD-10-CM | POA: Diagnosis not present

## 2015-10-19 DIAGNOSIS — H353113 Nonexudative age-related macular degeneration, right eye, advanced atrophic without subfoveal involvement: Secondary | ICD-10-CM | POA: Diagnosis not present

## 2015-10-19 DIAGNOSIS — H2513 Age-related nuclear cataract, bilateral: Secondary | ICD-10-CM | POA: Diagnosis not present

## 2015-11-19 DIAGNOSIS — I10 Essential (primary) hypertension: Secondary | ICD-10-CM | POA: Diagnosis not present

## 2015-11-19 DIAGNOSIS — J45909 Unspecified asthma, uncomplicated: Secondary | ICD-10-CM | POA: Diagnosis not present

## 2015-12-07 DIAGNOSIS — M1712 Unilateral primary osteoarthritis, left knee: Secondary | ICD-10-CM | POA: Diagnosis not present

## 2015-12-17 ENCOUNTER — Other Ambulatory Visit: Payer: Self-pay

## 2015-12-17 DIAGNOSIS — Z1231 Encounter for screening mammogram for malignant neoplasm of breast: Secondary | ICD-10-CM

## 2015-12-28 DIAGNOSIS — Z23 Encounter for immunization: Secondary | ICD-10-CM | POA: Diagnosis not present

## 2016-01-20 ENCOUNTER — Other Ambulatory Visit: Payer: Self-pay | Admitting: Internal Medicine

## 2016-01-20 ENCOUNTER — Ambulatory Visit
Admission: RE | Admit: 2016-01-20 | Discharge: 2016-01-20 | Disposition: A | Discharge status: Home / Self Care | Payer: Medicare Other | Source: Ambulatory Visit

## 2016-01-20 DIAGNOSIS — Z1231 Encounter for screening mammogram for malignant neoplasm of breast: Secondary | ICD-10-CM | POA: Diagnosis not present

## 2016-02-18 DIAGNOSIS — M1712 Unilateral primary osteoarthritis, left knee: Secondary | ICD-10-CM | POA: Diagnosis not present

## 2016-02-25 DIAGNOSIS — M170 Bilateral primary osteoarthritis of knee: Secondary | ICD-10-CM | POA: Diagnosis not present

## 2016-03-03 DIAGNOSIS — M1712 Unilateral primary osteoarthritis, left knee: Secondary | ICD-10-CM | POA: Diagnosis not present

## 2016-04-15 DIAGNOSIS — M170 Bilateral primary osteoarthritis of knee: Secondary | ICD-10-CM | POA: Diagnosis not present

## 2016-04-15 DIAGNOSIS — M1712 Unilateral primary osteoarthritis, left knee: Secondary | ICD-10-CM | POA: Diagnosis not present

## 2016-05-05 ENCOUNTER — Ambulatory Visit: Payer: Self-pay | Admitting: Orthopedic Surgery

## 2016-05-09 DIAGNOSIS — M1712 Unilateral primary osteoarthritis, left knee: Secondary | ICD-10-CM | POA: Diagnosis not present

## 2016-05-13 DIAGNOSIS — Z01810 Encounter for preprocedural cardiovascular examination: Secondary | ICD-10-CM | POA: Diagnosis not present

## 2016-05-13 DIAGNOSIS — J45909 Unspecified asthma, uncomplicated: Secondary | ICD-10-CM | POA: Diagnosis not present

## 2016-05-13 DIAGNOSIS — Z853 Personal history of malignant neoplasm of breast: Secondary | ICD-10-CM | POA: Diagnosis not present

## 2016-05-13 DIAGNOSIS — M47812 Spondylosis without myelopathy or radiculopathy, cervical region: Secondary | ICD-10-CM | POA: Diagnosis not present

## 2016-05-13 DIAGNOSIS — I10 Essential (primary) hypertension: Secondary | ICD-10-CM | POA: Diagnosis not present

## 2016-05-13 DIAGNOSIS — Z23 Encounter for immunization: Secondary | ICD-10-CM | POA: Diagnosis not present

## 2016-06-21 DIAGNOSIS — M1712 Unilateral primary osteoarthritis, left knee: Secondary | ICD-10-CM | POA: Diagnosis not present

## 2016-06-24 ENCOUNTER — Other Ambulatory Visit (HOSPITAL_COMMUNITY): Payer: Self-pay | Admitting: *Deleted

## 2016-06-24 NOTE — Progress Notes (Signed)
EKG AND MEDICAL CLEARANCE AND LOV NOTE ALL DATED 05-13-16 DR [facility] ON CHART

## 2016-06-24 NOTE — Patient Instructions (Signed)
Tammy Holland  06/24/2016   Your procedure is scheduled on: 07-04-16  Report to [facility] Main  Entrance take [facility]  elevators to 3rd floor to  [place] at 940 AM.  Call this number if you have problems the morning of surgery [phone]   Remember: ONLY 1 PERSON MAY GO WITH YOU TO SHORT STAY TO GET  READY MORNING OF [place].  Do not eat food or drink liquids :After Midnight.     Take these medicines the morning of surgery with A SIP OF WATER: ADVAIR, ALBUTEROL INHALER IF NEEDED AND BRING INHALER, EYE DROP IF NEEDED, OCEAN NASAL SPRAY IF NEEDED, FLONASE NASAL SPRAY IF NEEDED, FEXOFENADINE (ALLEGRA) IF NEEDED              You may not have any metal on your body including hair pins and              piercings  Do not wear jewelry, make-up, lotions, powders or perfumes, deodorant             Do not wear nail polish.  Do not shave  48 hours prior to surgery.              Men may shave face and neck.   Do not bring valuables to the hospital. [name].  Contacts, dentures or bridgework may not be worn into surgery.  Leave suitcase in the car. After surgery it may be brought to your room.                  Please read over the following fact sheets you were given: _____________________________________________________________________             [facility] - Preparing for Surgery Before surgery, you can play an important role.  Because skin is not sterile, your skin needs to be as free of germs as possible.  You can reduce the number of germs on your skin by washing with CHG (chlorahexidine gluconate) soap before surgery.  CHG is an antiseptic cleaner which kills germs and bonds with the skin to continue killing germs even after washing. Please DO NOT use if you have an allergy to CHG or antibacterial soaps.  If your skin becomes reddened/irritated stop using the CHG and inform your nurse when you  arrive at Short Stay. Do not shave (including legs and underarms) for at least 48 hours prior to the first CHG shower.  You may shave your face/neck. Please follow these instructions carefully:  1.  Shower with CHG Soap the night before surgery and the  morning of Surgery.  2.  If you choose to wash your hair, wash your hair first as usual with your  normal  shampoo.  3.  After you shampoo, rinse your hair and body thoroughly to remove the  shampoo.                           4.  Use CHG as you would any other liquid soap.  You can apply chg directly  to the skin and wash                       Gently with a scrungie or clean washcloth.  5.  Apply the CHG Soap to your body ONLY FROM THE NECK DOWN.   Do not use on face/ open                           Wound or open sores. Avoid contact with eyes, ears mouth and genitals (private parts).                       Wash face,  Genitals (private parts) with your normal soap.             6.  Wash thoroughly, paying special attention to the area where your surgery  will be performed.  7.  Thoroughly rinse your body with warm water from the neck down.  8.  DO NOT shower/wash with your normal soap after using and rinsing off  the CHG Soap.                9.  Pat yourself dry with a clean towel.            10.  Wear clean pajamas.            11.  Place clean sheets on your bed the night of your first shower and do not  sleep with pets. Day of Surgery : Do not apply any lotions/deodorants the morning of surgery.  Please wear clean clothes to the hospital/surgery center.  FAILURE TO FOLLOW THESE INSTRUCTIONS MAY RESULT IN THE CANCELLATION OF YOUR SURGERY PATIENT SIGNATURE_________________________________  NURSE SIGNATURE__________________________________  ________________________________________________________________________   [name]  An incentive spirometer is a tool that can help keep your lungs clear and active. This tool measures how  well you are filling your lungs with each breath. Taking long deep breaths may help reverse or decrease the chance of developing breathing (pulmonary) problems (especially infection) following:  A long period of time when you are unable to move or be active. BEFORE THE PROCEDURE   If the spirometer includes an indicator to show your best effort, your nurse or respiratory therapist will set it to a desired goal.  If possible, sit up straight or lean slightly forward. Try not to slouch.  Hold the incentive spirometer in an upright position. INSTRUCTIONS FOR USE  1. Sit on the edge of your bed if possible, or sit up as far as you can in bed or on a chair. 2. Hold the incentive spirometer in an upright position. 3. Breathe out normally. 4. Place the mouthpiece in your mouth and seal your Holland tightly around it. 5. Breathe in slowly and as deeply as possible, raising the piston or the ball toward the top of the column. 6. Hold your breath for 3-5 seconds or for as long as possible. Allow the piston or ball to fall to the bottom of the column. 7. Remove the mouthpiece from your mouth and breathe out normally. 8. Rest for a few seconds and repeat Steps 1 through 7 at least 10 times every 1-2 hours when you are awake. Take your time and take a few normal breaths between deep breaths. 9. The spirometer may include an indicator to show your best effort. Use the indicator as a goal to work toward during each repetition. 10. After each set of 10 deep breaths, practice coughing to be sure your lungs are clear. If you have an incision (the cut made at the time of surgery), support your incision when coughing by placing a  pillow or rolled up towels firmly against it. Once you are able to get out of bed, walk around indoors and cough well. You may stop using the incentive spirometer when instructed by your caregiver.  RISKS AND COMPLICATIONS  Take your time so you do not get dizzy or light-headed.  If you  are in pain, you may need to take or ask for pain medication before doing incentive spirometry. It is harder to take a deep breath if you are having pain. AFTER USE  Rest and breathe slowly and easily.  It can be helpful to keep track of a log of your progress. Your caregiver can provide you with a simple table to help with this. If you are using the spirometer at home, follow these instructions: [place] IF:   You are having difficultly using the spirometer.  You have trouble using the spirometer as often as instructed.  Your pain medication is not giving enough relief while using the spirometer.  You develop fever of 100.5 F (38.1 C) or higher. SEEK IMMEDIATE MEDICAL CARE IF:   You cough up bloody sputum that had not been present before.  You develop fever of 102 F (38.9 C) or greater.  You develop worsening pain at or near the incision site. MAKE SURE YOU:   Understand these instructions.  Will watch your condition.  Will get help right away if you are not doing well or get worse. Document Released: 11/21/2006 Document Revised: 10/03/2011 Document Reviewed: 01/22/2007 ExitCare Patient Information 2014 ExitCare, [place].   ________________________________________________________________________  WHAT IS A BLOOD TRANSFUSION? Blood Transfusion Information  A transfusion is the replacement of blood or some of its parts. Blood is made up of multiple cells which provide different functions.  Red blood cells carry oxygen and are used for blood loss replacement.  White blood cells fight against infection.  Platelets control bleeding.  Plasma helps clot blood.  Other blood products are available for specialized needs, such as hemophilia or other clotting disorders. BEFORE THE TRANSFUSION  Who gives blood for transfusions?   Healthy volunteers who are fully evaluated to make sure their blood is safe. This is blood bank blood. Transfusion therapy is the safest  it has ever been in the practice of medicine. Before blood is taken from a donor, a complete history is taken to make sure that person has no history of diseases nor engages in risky social behavior (examples are intravenous drug use or sexual activity with multiple partners). The donor's travel history is screened to minimize risk of transmitting infections, such as malaria. The donated blood is tested for signs of infectious diseases, such as HIV and hepatitis. The blood is then tested to be sure it is compatible with you in order to minimize the chance of a transfusion reaction. If you or a relative donates blood, this is often done in anticipation of surgery and is not appropriate for emergency situations. It takes many days to process the donated blood. RISKS AND COMPLICATIONS Although transfusion therapy is very safe and saves many lives, the main dangers of transfusion include:   Getting an infectious disease.  Developing a transfusion reaction. This is an allergic reaction to something in the blood you were given. Every precaution is taken to prevent this. The decision to have a blood transfusion has been considered carefully by your caregiver before blood is given. Blood is not given unless the benefits outweigh the risks. AFTER THE TRANSFUSION  Right after receiving a blood transfusion, you  will usually feel much better and more energetic. This is especially true if your red blood cells have gotten low (anemic). The transfusion raises the level of the red blood cells which carry oxygen, and this usually causes an energy increase.  The nurse administering the transfusion will monitor you carefully for complications. HOME CARE INSTRUCTIONS  No special instructions are needed after a transfusion. You may find your energy is better. Speak with your caregiver about any limitations on activity for underlying diseases you may have. SEEK MEDICAL CARE IF:   Your condition is not improving after  your transfusion.  You develop redness or irritation at the intravenous (IV) site. SEEK IMMEDIATE MEDICAL CARE IF:  Any of the following symptoms occur over the next 12 hours:  Shaking chills.  You have a temperature by mouth above 102 F (38.9 C), not controlled by medicine.  Chest, back, or muscle pain.  People around you feel you are not acting correctly or are confused.  Shortness of breath or difficulty breathing.  Dizziness and fainting.  You get a rash or develop hives.  You have a decrease in urine output.  Your urine turns a dark color or changes to pink, red, or brown. Any of the following symptoms occur over the next 10 days:  You have a temperature by mouth above 102 F (38.9 C), not controlled by medicine.  Shortness of breath.  Weakness after normal activity.  The white part of the eye turns yellow (jaundice).  You have a decrease in the amount of urine or are urinating less often.  Your urine turns a dark color or changes to pink, red, or brown. Document Released: 07/08/2000 Document Revised: 10/03/2011 Document Reviewed: 02/25/2008 [facility] Patient Information 2014 [place], [place].  _______________________________________________________________________

## 2016-06-27 ENCOUNTER — Encounter (HOSPITAL_COMMUNITY)
Admission: RE | Admit: 2016-06-27 | Discharge: 2016-06-27 | Disposition: A | Discharge status: Home / Self Care | Payer: Medicare Other | Source: Ambulatory Visit | Attending: Orthopedic Surgery | Admitting: Orthopedic Surgery

## 2016-06-27 ENCOUNTER — Encounter (HOSPITAL_COMMUNITY): Payer: Self-pay

## 2016-06-27 DIAGNOSIS — M1712 Unilateral primary osteoarthritis, left knee: Secondary | ICD-10-CM | POA: Diagnosis not present

## 2016-06-27 DIAGNOSIS — Z0183 Encounter for blood typing: Secondary | ICD-10-CM | POA: Diagnosis not present

## 2016-06-27 DIAGNOSIS — Z01812 Encounter for preprocedural laboratory examination: Secondary | ICD-10-CM | POA: Insufficient documentation

## 2016-06-27 HISTORY — DX: Retinal edema: H35.81

## 2016-06-27 HISTORY — DX: Dyspnea, unspecified: R06.00

## 2016-06-27 HISTORY — DX: Reserved for inherently not codable concepts without codable children: IMO0001

## 2016-06-27 HISTORY — DX: Unspecified osteoarthritis, unspecified site: M19.90

## 2016-06-27 LAB — URINALYSIS, ROUTINE W REFLEX MICROSCOPIC
Bilirubin Urine: NEGATIVE
Glucose, UA: NEGATIVE mg/dL
Hgb urine dipstick: NEGATIVE
Ketones, ur: NEGATIVE mg/dL
Leukocytes, UA: NEGATIVE
Nitrite: NEGATIVE
Protein, ur: NEGATIVE mg/dL
Specific Gravity, Urine: 1.008 (ref 1.005–1.030)
pH: 7 (ref 5.0–8.0)

## 2016-06-27 LAB — COMPREHENSIVE METABOLIC PANEL
ALT: 35 U/L (ref 14–54)
AST: 35 U/L (ref 15–41)
Albumin: 4.4 g/dL (ref 3.5–5.0)
Alkaline Phosphatase: 41 U/L (ref 38–126)
Anion gap: 6 (ref 5–15)
BUN: 23 mg/dL — ABNORMAL HIGH (ref 6–20)
CO2: 29 mmol/L (ref 22–32)
Calcium: 9.6 mg/dL (ref 8.9–10.3)
Chloride: 104 mmol/L (ref 101–111)
Creatinine, Ser: 0.81 mg/dL (ref 0.44–1.00)
GFR calc Af Amer: >60 mL/min (ref >60)
GFR calc non Af Amer: >60 mL/min (ref >60)
Glucose, Bld: 103 mg/dL — ABNORMAL HIGH (ref 65–99)
Potassium: 4.9 mmol/L (ref 3.5–5.1)
Sodium: 139 mmol/L (ref 135–145)
Total Bilirubin: 0.8 mg/dL (ref 0.3–1.2)
Total Protein: 6.7 g/dL (ref 6.5–8.1)

## 2016-06-27 LAB — PROTIME-INR
INR: 1.02
Prothrombin Time: 13.4 seconds (ref 11.4–15.2)

## 2016-06-27 LAB — APTT: aPTT: 30 seconds (ref 24–36)

## 2016-06-27 LAB — CBC
HCT: 40.1 % (ref 36.0–46.0)
Hemoglobin: 13.4 g/dL (ref 12.0–15.0)
MCH: 32.4 pg (ref 26.0–34.0)
MCHC: 33.4 g/dL (ref 30.0–36.0)
MCV: 96.9 fL (ref 78.0–100.0)
Platelets: 265 10*3/uL (ref 150–400)
RBC: 4.14 MIL/uL (ref 3.87–5.11)
RDW: 13 % (ref 11.5–15.5)
WBC: 8.5 10*3/uL (ref 4.0–10.5)

## 2016-06-27 LAB — SURGICAL PCR SCREEN
MRSA, PCR: NEGATIVE
Staphylococcus aureus: NEGATIVE

## 2016-06-27 LAB — ABO/RH: ABO/RH(D): A POS

## 2016-07-03 ENCOUNTER — Ambulatory Visit: Payer: Self-pay | Admitting: Orthopedic Surgery

## 2016-07-03 NOTE — H&P (Signed)
Tammy Holland DOB: February 24, 1941 Married / Language: English / Race: White Female Date of Admission:  07/04/2016 CC:  Left Knee Pain History of Present Illness  The patient is a 75 year old female who comes in for a preoperative History and Physical. The patient is scheduled for a left total knee arthroplasty to be performed by Dr. [name]. Aluisio, MD at [facility] on 07-04-2016. The patient is a 74 year old female who presented for follow up of their knee. The patient is being followed for their left knee pain and osteoarthritis. They are months out from Euflexxa series. Symptoms reported include: pain, aching and difficulty ambulating. The patient feels that they are doing poorly and report their pain level to be moderate. The following medication has been used for pain control: antiinflammatory medication and Tylenol. The patient has not gotten any relief of their symptoms with viscosupplementation (She states the Euflexxa series may have helped some; but not significantly). She did have some benefit in her right knee from the injection, but the left knee still is very problematic. She really would like to be more active, but the knees are preventing her from doing so. She does have some discomfort in the right knee, but the left is far worse. She is ready to proceed with surgery at this time. They have been treated conservatively in the past for the above stated problem and despite conservative measures, they continue to have progressive pain and severe functional limitations and dysfunction. They have failed non-operative management including home exercise, medications, and injections. It is felt that they would benefit from undergoing total joint replacement. Risks and benefits of the procedure have been discussed with the patient and they elect to proceed with surgery. There are no active contraindications to surgery such as ongoing infection or rapidly progressive neurological  disease.  Problem List/Past Medical  Primary osteoarthritis of both knees (M17.0)  Asthma  Breast Cancer  Chronic Obstructive Lung Disease  High blood pressure  Osteoarthritis  Hemorrhoids  Measles  Mumps  Rubella  Impaired Vision  Impaired Hearing  Tinnitus  Varicose veins   Allergies No Known Drug Allergies   Family History Cancer  Brother, Father, Maternal Grandfather. Depression  Brother. child Drug / Alcohol Addiction  Brother. First Degree Relatives  reported Heart Disease  Brother, Maternal Grandmother. Hypertension  Brother, Maternal Grandmother, Mother, Sister. Kidney disease  Maternal Grandfather. Liver Disease, Chronic  Brother, Father. Osteoarthritis  Brother, Maternal Grandfather, Maternal Grandmother, Mother, Sister.  Social History Children  2 Current drinker  11/28/2014: Currently drinks beer, wine and hard liquor 8-14 times per week Current work status  retired Furniture conservator/restorer daily; does running / walking and gym / [employer] Living situation  live with spouse Marital status  married No history of drug/alcohol rehab  Not under pain contract  Number of flights of stairs before winded  1 Tobacco / smoke exposure  11/28/2014: yes outdoors only Tobacco use  Former smoker. 11/28/2014 Advance Directives  Living Will, Healthcare POA  Medication History  Fluticasone Propionate (50MCG/ACT Suspension, Nasal) Active. Hydrochlorothiazide (12.5MG  Capsule, Oral) Active. Ventolin HFA (108 (90 Base)MCG/ACT Aerosol Soln, Inhalation) Active. Advair Diskus (Inhalation) Specific strength unknown - Active. Albuterol Sulfate (Inhalation) Specific strength unknown - Active. Allegra (Oral) Specific strength unknown - Active. PreserVision AREDS (Oral) Active. Glucosamine Complex (Oral) Active. Fish Oil Active. Biotin (Oral) Specific strength unknown - Active. Multivitamin (Oral) Active. Advil (200MG  Tablet, Oral as  needed) Active. Aleve (Oral as needed) Specific strength unknown -  Active. Aspirin EC (325MG  Tablet DR, Oral as needed) Active.   Past Surgical History Breast Biopsy  left Breast Mass; Local Excision  left Tonsillectomy  Date: 31. Partial Mastectomy  Date: 11/2007.   Review of Systems  General Not Present- Chills, Fatigue, Fever, Memory Loss, Night Sweats, Weight Gain and Weight Loss. Skin Not Present- Eczema, Hives, Itching, Lesions and Rash. HEENT Present- Hearing Loss. Not Present- Dentures, Double Vision, Headache, Tinnitus and Visual Loss. Respiratory Present- Shortness of breath with exertion. Not Present- Allergies, Chronic Cough, Coughing up blood and Shortness of breath at rest. Cardiovascular Not Present- Chest Pain, Difficulty Breathing Lying Down, Murmur, Palpitations, Racing/skipping heartbeats and Swelling. Gastrointestinal Present- Constipation. Not Present- Abdominal Pain, Bloody Stool, Diarrhea, Difficulty Swallowing, Heartburn, Jaundice, Loss of appetitie, Nausea and Vomiting. Female Genitourinary Not Present- Blood in Urine, Discharge, Flank Pain, Incontinence, Painful Urination, Urgency, Urinary frequency, Urinary Retention, Urinating at Night and Weak urinary stream. Musculoskeletal Present- Joint Pain, Morning Stiffness and Spasms. Not Present- Back Pain, Joint Swelling, Muscle Pain and Muscle Weakness. Neurological Not Present- Blackout spells, Difficulty with balance, Dizziness, Paralysis, Tremor and Weakness. Psychiatric Not Present- Insomnia.  Vitals  Weight: 125 lb Height: 64.75in Weight was reported by patient. Height was reported by patient. Body Surface Area: 1.62 m Body Mass Index: 20.96 kg/m  Pulse: 76 (Regular)  BP: 134/82 (Sitting, Right Arm, Standard)   Physical Exam General Mental Status -Alert, cooperative and good historian. General Appearance-pleasant, Not in acute distress. Orientation-Oriented X3. Build &  Nutrition-Well nourished and Well developed.  Head and Neck Head-normocephalic, atraumatic . Neck Global Assessment - supple, no bruit auscultated on the right, no bruit auscultated on the left.  Eye Vision-Wears corrective lenses. Pupil - Bilateral-Regular and Round. Motion - Bilateral-EOMI.  ENMT Note: Bilateral Hearing aids   Chest and Lung Exam Auscultation Breath sounds - clear at anterior chest wall and clear at posterior chest wall. Adventitious sounds - No Adventitious sounds.  Cardiovascular Auscultation Rhythm - Regular rate and rhythm. Heart Sounds - S1 WNL and S2 WNL. Murmurs & Other Heart Sounds - Auscultation of the heart reveals - No Murmurs.  Abdomen Palpation/Percussion Tenderness - Abdomen is non-tender to palpation. Rigidity (guarding) - Abdomen is soft. Auscultation Auscultation of the abdomen reveals - Bowel sounds normal.  Female Genitourinary Note: Not done, not pertinent to present illness   Musculoskeletal Note: On exam, she is alert and oriented, in no apparent distress. Evaluation of her left knee shows no effusion. Range is about 5 to 125, moderate crepitus on range of motion. Tenderness medial greater than lateral with no instability. Her right knee shows no effusion. Range is 0 to 125. Slight crepitus on range of motion, minimal medial tenderness. No lateral tenderness or instability.  RADIOGRAPHS We reviewed her x-rays. She has bone on bone medial and patellofemoral in the left knee. She has significant medial joint space narrowing in her right.  Assessment & Plan Primary osteoarthritis of left knee (M17.12)  Note:Surgical Plans: Left Total Knee Replacement  Disposition: Home  PCP: Dr. Kurtis Bushman - Patient has been seen preoperatively and felt to be stable for surgery.  Topical TXA  Anesthesia Issues: None  Signed electronically by Ok Edwards, III PA-C

## 2016-07-04 ENCOUNTER — Inpatient Hospital Stay (HOSPITAL_COMMUNITY): Payer: Medicare Other | Admitting: Anesthesiology

## 2016-07-04 ENCOUNTER — Encounter (HOSPITAL_COMMUNITY): Payer: Self-pay | Admitting: *Deleted

## 2016-07-04 ENCOUNTER — Inpatient Hospital Stay (HOSPITAL_COMMUNITY)
Admission: RE | Admit: 2016-07-04 | Discharge: 2016-07-06 | DRG: 470 | Disposition: A | Discharge status: Home Health | Payer: Medicare Other | Source: Ambulatory Visit | Attending: Orthopedic Surgery | Admitting: Orthopedic Surgery

## 2016-07-04 ENCOUNTER — Encounter (HOSPITAL_COMMUNITY)
Admission: RE | Disposition: A | Discharge status: Home Health | Payer: Self-pay | Source: Ambulatory Visit | Attending: Orthopedic Surgery

## 2016-07-04 DIAGNOSIS — J45909 Unspecified asthma, uncomplicated: Secondary | ICD-10-CM | POA: Diagnosis present

## 2016-07-04 DIAGNOSIS — M25562 Pain in left knee: Secondary | ICD-10-CM | POA: Diagnosis not present

## 2016-07-04 DIAGNOSIS — H9193 Unspecified hearing loss, bilateral: Secondary | ICD-10-CM | POA: Diagnosis present

## 2016-07-04 DIAGNOSIS — M1712 Unilateral primary osteoarthritis, left knee: Principal | ICD-10-CM | POA: Diagnosis present

## 2016-07-04 DIAGNOSIS — I10 Essential (primary) hypertension: Secondary | ICD-10-CM | POA: Diagnosis present

## 2016-07-04 DIAGNOSIS — M1710 Unilateral primary osteoarthritis, unspecified knee: Secondary | ICD-10-CM | POA: Diagnosis present

## 2016-07-04 DIAGNOSIS — Z96652 Presence of left artificial knee joint: Secondary | ICD-10-CM

## 2016-07-04 DIAGNOSIS — M179 Osteoarthritis of knee, unspecified: Secondary | ICD-10-CM

## 2016-07-04 HISTORY — PX: TOTAL KNEE ARTHROPLASTY: SHX125

## 2016-07-04 LAB — TYPE AND SCREEN
ABO/RH(D): A POS
Antibody Screen: NEGATIVE

## 2016-07-04 SURGERY — ARTHROPLASTY, KNEE, TOTAL
Anesthesia: Spinal | Site: Knee | Laterality: Left

## 2016-07-04 MED ORDER — LACTATED RINGERS IV SOLN
INTRAVENOUS | Status: DC
  Administered 2016-07-04: 1000 mL via INTRAVENOUS

## 2016-07-04 MED ORDER — OXYCODONE HCL 5 MG PO TABS
5.0000 mg | ORAL_TABLET | ORAL | Status: DC | PRN
  Administered 2016-07-04: 22:00:00 5 mg via ORAL
  Administered 2016-07-05 (×5): 10 mg via ORAL
  Administered 2016-07-05: 5 mg via ORAL
  Administered 2016-07-05 – 2016-07-06 (×3): 10 mg via ORAL
  Filled 2016-07-04 (×5): qty 2
  Filled 2016-07-04: qty 1
  Filled 2016-07-04 (×4): qty 2

## 2016-07-04 MED ORDER — PROPOFOL 500 MG/50ML IV EMUL
INTRAVENOUS | Status: DC | PRN
  Administered 2016-07-04: 40 ug/kg/min via INTRAVENOUS

## 2016-07-04 MED ORDER — ACETAMINOPHEN 650 MG RE SUPP: 650.0000 mg | Freq: Four times a day (QID) | RECTAL | Status: DC | PRN

## 2016-07-04 MED ORDER — FLUTICASONE PROPIONATE 50 MCG/ACT NA SUSP
1.0000 | Freq: Every day | NASAL | Status: DC | PRN
  Filled 2016-07-04: qty 16

## 2016-07-04 MED ORDER — MIDAZOLAM HCL 2 MG/2ML IJ SOLN
INTRAMUSCULAR | Status: AC
  Filled 2016-07-04: qty 2

## 2016-07-04 MED ORDER — DEXAMETHASONE SODIUM PHOSPHATE 10 MG/ML IJ SOLN
10.0000 mg | Freq: Once | INTRAMUSCULAR | Status: AC
  Administered 2016-07-05: 09:00:00 10 mg via INTRAVENOUS
  Filled 2016-07-04: qty 1

## 2016-07-04 MED ORDER — DIPHENHYDRAMINE HCL 12.5 MG/5ML PO ELIX: 12.5000 mg | ORAL_SOLUTION | ORAL | Status: DC | PRN

## 2016-07-04 MED ORDER — METOCLOPRAMIDE HCL 5 MG/ML IJ SOLN: 5.0000 mg | Freq: Three times a day (TID) | INTRAMUSCULAR | Status: DC | PRN

## 2016-07-04 MED ORDER — PROPOFOL 10 MG/ML IV BOLUS
INTRAVENOUS | Status: AC
  Filled 2016-07-04: qty 40

## 2016-07-04 MED ORDER — BUPIVACAINE HCL (PF) 0.25 % IJ SOLN
INTRAMUSCULAR | Status: AC
  Filled 2016-07-04: qty 30

## 2016-07-04 MED ORDER — MOMETASONE FURO-FORMOTEROL FUM 200-5 MCG/ACT IN AERO
2.0000 | INHALATION_SPRAY | Freq: Two times a day (BID) | RESPIRATORY_TRACT | Status: DC
  Administered 2016-07-04 – 2016-07-05 (×3): 2 via RESPIRATORY_TRACT
  Filled 2016-07-04: qty 8.8

## 2016-07-04 MED ORDER — ONDANSETRON HCL 4 MG/2ML IJ SOLN
INTRAMUSCULAR | Status: DC | PRN
  Administered 2016-07-04: 4 mg via INTRAVENOUS

## 2016-07-04 MED ORDER — MIDAZOLAM HCL 5 MG/5ML IJ SOLN
INTRAMUSCULAR | Status: DC | PRN
  Administered 2016-07-04: 2 mg via INTRAVENOUS

## 2016-07-04 MED ORDER — RIVAROXABAN 10 MG PO TABS
10.0000 mg | ORAL_TABLET | Freq: Every day | ORAL | Status: DC
  Administered 2016-07-05 – 2016-07-06 (×2): 10 mg via ORAL
  Filled 2016-07-04 (×2): qty 1

## 2016-07-04 MED ORDER — LORATADINE 10 MG PO TABS
10.0000 mg | ORAL_TABLET | Freq: Every day | ORAL | Status: DC
Start: 2016-07-05 — End: 2016-07-06
  Administered 2016-07-05 – 2016-07-06 (×2): 10 mg via ORAL
  Filled 2016-07-04 (×2): qty 1

## 2016-07-04 MED ORDER — ACETAMINOPHEN 325 MG PO TABS: 650.0000 mg | ORAL_TABLET | Freq: Four times a day (QID) | ORAL | Status: DC | PRN

## 2016-07-04 MED ORDER — PHENOL 1.4 % MT LIQD
1.0000 | OROMUCOSAL | Status: DC | PRN
  Filled 2016-07-04: qty 177

## 2016-07-04 MED ORDER — FENTANYL CITRATE (PF) 100 MCG/2ML IJ SOLN: 25.0000 ug | INTRAMUSCULAR | Status: DC | PRN

## 2016-07-04 MED ORDER — SODIUM CHLORIDE 0.9 % IV SOLN
INTRAVENOUS | Status: DC
  Administered 2016-07-04: 18:00:00 via INTRAVENOUS

## 2016-07-04 MED ORDER — DEXAMETHASONE SODIUM PHOSPHATE 10 MG/ML IJ SOLN
INTRAMUSCULAR | Status: AC
  Filled 2016-07-04: qty 1

## 2016-07-04 MED ORDER — TRAMADOL HCL 50 MG PO TABS
50.0000 mg | ORAL_TABLET | Freq: Four times a day (QID) | ORAL | Status: DC | PRN
  Administered 2016-07-05: 18:00:00 100 mg via ORAL
  Filled 2016-07-04 (×2): qty 2

## 2016-07-04 MED ORDER — DOCUSATE SODIUM 100 MG PO CAPS
100.0000 mg | ORAL_CAPSULE | Freq: Two times a day (BID) | ORAL | Status: DC
  Administered 2016-07-04 – 2016-07-06 (×4): 100 mg via ORAL
  Filled 2016-07-04 (×4): qty 1

## 2016-07-04 MED ORDER — BUPIVACAINE IN DEXTROSE 0.75-8.25 % IT SOLN
INTRATHECAL | Status: DC | PRN
  Administered 2016-07-04: 1.8 mL via INTRATHECAL

## 2016-07-04 MED ORDER — CEFAZOLIN SODIUM-DEXTROSE 2-4 GM/100ML-% IV SOLN
2.0000 g | INTRAVENOUS | Status: AC
  Administered 2016-07-04: 2 g via INTRAVENOUS
  Filled 2016-07-04: qty 100

## 2016-07-04 MED ORDER — METHOCARBAMOL 500 MG PO TABS
500.0000 mg | ORAL_TABLET | Freq: Four times a day (QID) | ORAL | Status: DC | PRN
  Administered 2016-07-05 – 2016-07-06 (×2): 500 mg via ORAL
  Filled 2016-07-04 (×2): qty 1

## 2016-07-04 MED ORDER — TRANEXAMIC ACID 1000 MG/10ML IV SOLN
2000.0000 mg | Freq: Once | INTRAVENOUS | Status: DC
  Filled 2016-07-04: qty 20

## 2016-07-04 MED ORDER — ALBUTEROL SULFATE (2.5 MG/3ML) 0.083% IN NEBU: 3.0000 mL | INHALATION_SOLUTION | RESPIRATORY_TRACT | Status: DC | PRN

## 2016-07-04 MED ORDER — FENTANYL CITRATE (PF) 100 MCG/2ML IJ SOLN
INTRAMUSCULAR | Status: AC
  Filled 2016-07-04: qty 2

## 2016-07-04 MED ORDER — CEFAZOLIN SODIUM-DEXTROSE 2-4 GM/100ML-% IV SOLN
INTRAVENOUS | Status: AC
Start: 2016-07-04 — End: 2016-07-04
  Filled 2016-07-04: qty 100

## 2016-07-04 MED ORDER — DEXAMETHASONE SODIUM PHOSPHATE 10 MG/ML IJ SOLN
10.0000 mg | Freq: Once | INTRAMUSCULAR | Status: AC
  Administered 2016-07-04: 10 mg via INTRAVENOUS

## 2016-07-04 MED ORDER — ACETAMINOPHEN 10 MG/ML IV SOLN
INTRAVENOUS | Status: AC
  Filled 2016-07-04: qty 100

## 2016-07-04 MED ORDER — MENTHOL 3 MG MT LOZG: 1.0000 | LOZENGE | OROMUCOSAL | Status: DC | PRN

## 2016-07-04 MED ORDER — LIDOCAINE 2% (20 MG/ML) 5 ML SYRINGE
INTRAMUSCULAR | Status: DC | PRN
  Administered 2016-07-04: 100 mg via INTRAVENOUS

## 2016-07-04 MED ORDER — FLEET ENEMA 7-19 GM/118ML RE ENEM: 1.0000 | ENEMA | Freq: Once | RECTAL | Status: DC | PRN

## 2016-07-04 MED ORDER — EPHEDRINE 5 MG/ML INJ
INTRAVENOUS | Status: AC
  Filled 2016-07-04: qty 10

## 2016-07-04 MED ORDER — GUAIFENESIN ER 600 MG PO TB12: 600.0000 mg | ORAL_TABLET | Freq: Two times a day (BID) | ORAL | Status: DC | PRN

## 2016-07-04 MED ORDER — BISACODYL 10 MG RE SUPP: 10.0000 mg | Freq: Every day | RECTAL | Status: DC | PRN

## 2016-07-04 MED ORDER — FENTANYL CITRATE (PF) 100 MCG/2ML IJ SOLN
INTRAMUSCULAR | Status: DC | PRN
  Administered 2016-07-04 (×2): 50 ug via INTRAVENOUS

## 2016-07-04 MED ORDER — METOCLOPRAMIDE HCL 5 MG PO TABS: 5.0000 mg | ORAL_TABLET | Freq: Three times a day (TID) | ORAL | Status: DC | PRN

## 2016-07-04 MED ORDER — STERILE WATER FOR IRRIGATION IR SOLN
Status: DC | PRN
  Administered 2016-07-04: 2000 mL

## 2016-07-04 MED ORDER — CEFAZOLIN SODIUM-DEXTROSE 2-4 GM/100ML-% IV SOLN
2.0000 g | Freq: Four times a day (QID) | INTRAVENOUS | Status: AC
  Administered 2016-07-04 – 2016-07-05 (×2): 2 g via INTRAVENOUS
  Filled 2016-07-04 (×3): qty 100

## 2016-07-04 MED ORDER — LIDOCAINE 2% (20 MG/ML) 5 ML SYRINGE
INTRAMUSCULAR | Status: AC
  Filled 2016-07-04: qty 5

## 2016-07-04 MED ORDER — ONDANSETRON HCL 4 MG/2ML IJ SOLN: 4.0000 mg | Freq: Four times a day (QID) | INTRAMUSCULAR | Status: DC | PRN

## 2016-07-04 MED ORDER — ONDANSETRON HCL 4 MG PO TABS: 4.0000 mg | ORAL_TABLET | Freq: Four times a day (QID) | ORAL | Status: DC | PRN

## 2016-07-04 MED ORDER — GUAIFENESIN-CODEINE 100-10 MG/5ML PO SYRP
5.0000 mL | ORAL_SOLUTION | Freq: Three times a day (TID) | ORAL | Status: DC | PRN
  Filled 2016-07-04: qty 5

## 2016-07-04 MED ORDER — POLYETHYLENE GLYCOL 3350 17 G PO PACK: 17.0000 g | PACK | Freq: Every day | ORAL | Status: DC | PRN

## 2016-07-04 MED ORDER — ONDANSETRON HCL 4 MG/2ML IJ SOLN
INTRAMUSCULAR | Status: AC
  Filled 2016-07-04: qty 2

## 2016-07-04 MED ORDER — BUPIVACAINE LIPOSOME 1.3 % IJ SUSP
20.0000 mL | Freq: Once | INTRAMUSCULAR | Status: DC
  Filled 2016-07-04: qty 20

## 2016-07-04 MED ORDER — SODIUM CHLORIDE 0.9 % IJ SOLN
INTRAMUSCULAR | Status: AC
  Filled 2016-07-04: qty 50

## 2016-07-04 MED ORDER — ACETAMINOPHEN 500 MG PO TABS
1000.0000 mg | ORAL_TABLET | Freq: Four times a day (QID) | ORAL | Status: AC
  Administered 2016-07-04 – 2016-07-05 (×3): 1000 mg via ORAL
  Filled 2016-07-04 (×6): qty 2

## 2016-07-04 MED ORDER — BUPIVACAINE HCL 0.25 % IJ SOLN
INTRAMUSCULAR | Status: DC | PRN
  Administered 2016-07-04: 20 mL

## 2016-07-04 MED ORDER — SODIUM CHLORIDE 0.9 % IJ SOLN
INTRAMUSCULAR | Status: DC | PRN
  Administered 2016-07-04: 30 mL

## 2016-07-04 MED ORDER — BUPIVACAINE LIPOSOME 1.3 % IJ SUSP
INTRAMUSCULAR | Status: DC | PRN
  Administered 2016-07-04: 20 mL

## 2016-07-04 MED ORDER — METHOCARBAMOL 1000 MG/10ML IJ SOLN
500.0000 mg | Freq: Four times a day (QID) | INTRAVENOUS | Status: DC | PRN
  Administered 2016-07-04: 500 mg via INTRAVENOUS
  Filled 2016-07-04: qty 550
  Filled 2016-07-04: qty 5

## 2016-07-04 MED ORDER — ACETAMINOPHEN 10 MG/ML IV SOLN
1000.0000 mg | Freq: Once | INTRAVENOUS | Status: AC
  Administered 2016-07-04: 1000 mg via INTRAVENOUS
  Filled 2016-07-04: qty 100

## 2016-07-04 MED ORDER — MORPHINE SULFATE (PF) 2 MG/ML IV SOLN: 1.0000 mg | INTRAVENOUS | Status: DC | PRN

## 2016-07-04 MED ORDER — KETOROLAC TROMETHAMINE 30 MG/ML IJ SOLN
INTRAMUSCULAR | Status: AC
  Filled 2016-07-04: qty 1

## 2016-07-04 MED ORDER — HYDROCHLOROTHIAZIDE 12.5 MG PO CAPS
12.5000 mg | ORAL_CAPSULE | Freq: Every day | ORAL | Status: DC
  Administered 2016-07-06: 10:00:00 12.5 mg via ORAL
  Filled 2016-07-04 (×2): qty 1

## 2016-07-04 MED ORDER — TRANEXAMIC ACID 1000 MG/10ML IV SOLN
INTRAVENOUS | Status: DC | PRN
  Administered 2016-07-04: 2000 mg via TOPICAL

## 2016-07-04 MED ORDER — KETOTIFEN FUMARATE 0.025 % OP SOLN
1.0000 [drp] | Freq: Two times a day (BID) | OPHTHALMIC | Status: DC | PRN
  Filled 2016-07-04: qty 5

## 2016-07-04 MED ORDER — EPHEDRINE SULFATE-NACL 50-0.9 MG/10ML-% IV SOSY
PREFILLED_SYRINGE | INTRAVENOUS | Status: DC | PRN
  Administered 2016-07-04 (×3): 5 mg via INTRAVENOUS

## 2016-07-04 MED ORDER — 0.9 % SODIUM CHLORIDE (POUR BTL) OPTIME
TOPICAL | Status: DC | PRN
  Administered 2016-07-04: 1000 mL

## 2016-07-04 SURGICAL SUPPLY — 50 items
BAG DECANTER FOR FLEXI CONT (MISCELLANEOUS) ×2 IMPLANT
BAG SPEC THK2 15X12 ZIP CLS (MISCELLANEOUS) ×1
BAG ZIPLOCK 12X15 (MISCELLANEOUS) ×2 IMPLANT
BANDAGE ACE 6X5 VEL STRL LF (GAUZE/BANDAGES/DRESSINGS) ×2 IMPLANT
BLADE SAG 18X100X1.27 (BLADE) ×2 IMPLANT
BLADE SAW SGTL 11.0X1.19X90.0M (BLADE) ×2 IMPLANT
BOWL SMART MIX CTS (DISPOSABLE) ×2 IMPLANT
CAPT KNEE TOTAL 3 ATTUNE ×1 IMPLANT
CEMENT HV SMART SET (Cement) ×4 IMPLANT
CLOTH BEACON ORANGE TIMEOUT ST (SAFETY) ×2 IMPLANT
CUFF TOURN SGL QUICK 34 (TOURNIQUET CUFF) ×2
CUFF TRNQT CYL 34X4X40X1 (TOURNIQUET CUFF) ×1 IMPLANT
DECANTER SPIKE VIAL GLASS SM (MISCELLANEOUS) ×2 IMPLANT
DRAPE U-SHAPE 47X51 STRL (DRAPES) ×2 IMPLANT
DRSG ADAPTIC 3X8 NADH LF (GAUZE/BANDAGES/DRESSINGS) ×2 IMPLANT
DRSG PAD ABDOMINAL 8X10 ST (GAUZE/BANDAGES/DRESSINGS) ×2 IMPLANT
DURAPREP 26ML APPLICATOR (WOUND CARE) ×2 IMPLANT
ELECT REM PT RETURN 9FT ADLT (ELECTROSURGICAL) ×2
ELECTRODE REM PT RTRN 9FT ADLT (ELECTROSURGICAL) ×1 IMPLANT
EVACUATOR 1/8 PVC DRAIN (DRAIN) ×2 IMPLANT
GAUZE SPONGE 4X4 12PLY STRL (GAUZE/BANDAGES/DRESSINGS) ×2 IMPLANT
GLOVE BIO SURGEON STRL SZ7.5 (GLOVE) ×1 IMPLANT
GLOVE BIO SURGEON STRL SZ8 (GLOVE) ×2 IMPLANT
GLOVE BIOGEL PI IND STRL 6.5 (GLOVE) IMPLANT
GLOVE BIOGEL PI IND STRL 8 (GLOVE) ×1 IMPLANT
GLOVE BIOGEL PI INDICATOR 6.5 (GLOVE)
GLOVE BIOGEL PI INDICATOR 8 (GLOVE) ×2
GLOVE SURG SS PI 6.5 STRL IVOR (GLOVE) IMPLANT
GOWN STRL REUS W/TWL LRG LVL3 (GOWN DISPOSABLE) ×2 IMPLANT
GOWN STRL REUS W/TWL XL LVL3 (GOWN DISPOSABLE) ×1 IMPLANT
HANDPIECE INTERPULSE COAX TIP (DISPOSABLE) ×2
IMMOBILIZER KNEE 20 (SOFTGOODS) ×2
IMMOBILIZER KNEE 20 THIGH 36 (SOFTGOODS) ×1 IMPLANT
MANIFOLD NEPTUNE II (INSTRUMENTS) ×2 IMPLANT
NS IRRIG 1000ML POUR BTL (IV SOLUTION) ×2 IMPLANT
PACK TOTAL KNEE CUSTOM (KITS) ×2 IMPLANT
PADDING CAST COTTON 6X4 STRL (CAST SUPPLIES) ×5 IMPLANT
POSITIONER SURGICAL ARM (MISCELLANEOUS) ×2 IMPLANT
SET HNDPC FAN SPRY TIP SCT (DISPOSABLE) ×1 IMPLANT
STRIP CLOSURE SKIN 1/2X4 (GAUZE/BANDAGES/DRESSINGS) ×4 IMPLANT
SUT MNCRL AB 4-0 PS2 18 (SUTURE) ×2 IMPLANT
SUT VIC AB 2-0 CT1 27 (SUTURE) ×6
SUT VIC AB 2-0 CT1 TAPERPNT 27 (SUTURE) ×3 IMPLANT
SUT VLOC 180 0 24IN GS25 (SUTURE) ×2 IMPLANT
SYR 50ML LL SCALE MARK (SYRINGE) ×2 IMPLANT
TRAY FOLEY CATH 14FRSI W/METER (CATHETERS) ×1 IMPLANT
TRAY FOLEY W/METER SILVER 16FR (SET/KITS/TRAYS/PACK) ×1 IMPLANT
WATER STERILE IRR 1500ML POUR (IV SOLUTION) ×2 IMPLANT
WRAP KNEE MAXI GEL POST OP (GAUZE/BANDAGES/DRESSINGS) ×2 IMPLANT
YANKAUER SUCT BULB TIP 10FT TU (MISCELLANEOUS) ×2 IMPLANT

## 2016-07-04 NOTE — Op Note (Signed)
OPERATIVE REPORT-TOTAL KNEE ARTHROPLASTY   Pre-operative diagnosis- Osteoarthritis  Left knee(s)  Post-operative diagnosis- Osteoarthritis Left knee(s)  Procedure-  Left  Total Knee Arthroplasty  Surgeon- [name]. Tynia Wiers, MD  Assistant- Arlee Muslim, PA-C   Anesthesia-  Spinal  EBL-* No blood loss amount entered *   Drains Hemovac  Tourniquet time-  Total Tourniquet Time Documented: Thigh (Left) - 27 minutes Total: Thigh (Left) - 27 minutes     Complications- None  Condition-PACU - hemodynamically stable.   Brief Clinical Note  Tammy Holland is a 75 y.o. year old female with end stage OA of her left knee with progressively worsening pain and dysfunction. She has constant pain, with activity and at rest and significant functional deficits with difficulties even with ADLs. She has had extensive non-op management including analgesics, injections of cortisone and viscosupplements, and home exercise program, but remains in significant pain with significant dysfunction. Radiographs show bone on bone arthritis medial and patellofemoral. She presents now for left Total Knee Arthroplasty.    Procedure in detail---   The patient is brought into the operating room and positioned supine on the operating table. After successful administration of  Spinal,   a tourniquet is placed high on the  Left thigh(s) and the lower extremity is prepped and draped in the usual sterile fashion. Time out is performed by the operating team and then the  Left lower extremity is wrapped in Esmarch, knee flexed and the tourniquet inflated to 300 mmHg.       A midline incision is made with a ten blade through the subcutaneous tissue to the level of the extensor mechanism. A fresh blade is used to make a medial parapatellar arthrotomy. Soft tissue over the proximal medial tibia is subperiosteally elevated to the joint line with a knife and into the semimembranosus bursa with a Cobb elevator. Soft tissue over  the proximal lateral tibia is elevated with attention being paid to avoiding the patellar tendon on the tibial tubercle. The patella is everted, knee flexed 90 degrees and the ACL and PCL are removed. Findings are bone on bone medial and patellofemoral with large global osteophytes.        The drill is used to create a starting hole in the distal femur and the canal is thoroughly irrigated with sterile saline to remove the fatty contents. The 5 degree Left  valgus alignment guide is placed into the femoral canal and the distal femoral cutting block is pinned to remove 9 mm off the distal femur. Resection is made with an oscillating saw.      The tibia is subluxed forward and the menisci are removed. The extramedullary alignment guide is placed referencing proximally at the medial aspect of the tibial tubercle and distally along the second metatarsal axis and tibial crest. The block is pinned to remove 74mm off the more deficient medial  side. Resection is made with an oscillating saw. Size 4is the most appropriate size for the tibia and the proximal tibia is prepared with the modular drill and keel punch for that size.      The femoral sizing guide is placed and size 5 is most appropriate. Rotation is marked off the epicondylar axis and confirmed by creating a rectangular flexion gap at 90 degrees. The size 5 cutting block is pinned in this rotation and the anterior, posterior and chamfer cuts are made with the oscillating saw. The intercondylar block is then placed and that cut is made.  Trial size 4 tibial component, trial size 5 narrow posterior stabilized femur and a 8  mm posterior stabilized rotating platform insert trial is placed. Full extension is achieved with excellent varus/valgus and anterior/posterior balance throughout full range of motion. The patella is everted and thickness measured to be 22  mm. Free hand resection is taken to 12 mm, a 35 template is placed, lug holes are drilled, trial  patella is placed, and it tracks normally. Osteophytes are removed off the posterior femur with the trial in place. All trials are removed and the cut bone surfaces prepared with pulsatile lavage. Cement is mixed and once ready for implantation, the size 4 tibial implant, size  5 narrow posterior stabilized femoral component, and the size 35 patella are cemented in place and the patella is held with the clamp. The trial insert is placed and the knee held in full extension. The Exparel (20 ml mixed with 30 ml saline) and .25% Bupivicaine, are injected into the extensor mechanism, posterior capsule, medial and lateral gutters and subcutaneous tissues.  All extruded cement is removed and once the cement is hard the permanent 8 mm posterior stabilized rotating platform insert is placed into the tibial tray.      The wound is copiously irrigated with saline solution and the extensor mechanism closed over a hemovac drain with #1 V-loc suture. The tourniquet is released for a total tourniquet time of 27  minutes. Flexion against gravity is 140 degrees and the patella tracks normally. Subcutaneous tissue is closed with 2.0 vicryl and subcuticular with running 4.0 Monocryl. The incision is cleaned and dried and steri-strips and a bulky sterile dressing are applied. The limb is placed into a knee immobilizer and the patient is awakened and transported to recovery in stable condition.      Please note that a surgical assistant was a medical necessity for this procedure in order to perform it in a safe and expeditious manner. Surgical assistant was necessary to retract the ligaments and vital neurovascular structures to prevent injury to them and also necessary for proper positioning of the limb to allow for anatomic placement of the prosthesis.   [name] Dawson Albers, MD    07/04/2016, 1:54 PM

## 2016-07-04 NOTE — Anesthesia Postprocedure Evaluation (Signed)
Anesthesia Post Note  Patient: Davette Houchin Riviere  Procedure(s) Performed: Procedure(s) (LRB): LEFT TOTAL KNEE ARTHROPLASTY (Left)  Patient location during evaluation: PACU Anesthesia Type: Spinal and MAC Level of consciousness: awake and alert Pain management: pain level controlled Vital Signs Assessment: post-procedure vital signs reviewed and stable Respiratory status: spontaneous breathing, respiratory function stable and patient connected to nasal cannula oxygen Cardiovascular status: blood pressure returned to baseline and stable Postop Assessment: spinal receding Anesthetic complications: no    Last Vitals:  Vitals:   07/04/16 1506 07/04/16 1529  BP: 122/67 (!) 124/54  Pulse:  (!) 54  Resp:  18  Temp: 36.4 C 36.4 C    Last Pain:  Vitals:   07/04/16 1500  PainSc: 0-No pain                 Miri Jose,W. EDMOND

## 2016-07-04 NOTE — Anesthesia Procedure Notes (Signed)
Spinal  Patient location during procedure: OR Start time: 07/04/2016 1:50 PM End time: 07/04/2016 12:55 PM Staffing Resident/CRNA: Markayla Reichart A Preanesthetic Checklist Completed: patient identified, site marked, surgical consent, pre-op evaluation, timeout performed, IV checked, risks and benefits discussed and monitors and equipment checked Spinal Block Patient position: sitting Prep: DuraPrep Patient monitoring: continuous pulse ox, blood pressure and heart rate Approach: midline Location: L3-4 Injection technique: single-shot Needle Needle type: Spinocan and Sprotte  Needle gauge: 24 G Needle length: 9 cm Assessment Sensory level: T4 Additional Notes Pt placed in sitting position. Pt tolerated well. Spinal kit expiration dated checked and verified. + CSF, - heme

## 2016-07-04 NOTE — Transfer of Care (Signed)
Immediate Anesthesia Transfer of Care Note  Patient: Tammy Holland  Procedure(s) Performed: Procedure(s): LEFT TOTAL KNEE ARTHROPLASTY (Left)  Patient Location: PACU  Anesthesia Type:MAC and Spinal  Level of Consciousness: awake, alert , oriented and patient cooperative  Airway & Oxygen Therapy: Patient Spontanous Breathing and Patient connected to face mask oxygen  Post-op Assessment: Report given to RN and Post -op Vital signs reviewed and stable  Post vital signs: Reviewed and stable  Last Vitals: There were no vitals filed for this visit.  Last Pain: There were no vitals filed for this visit.    Patients Stated Pain Goal: 3 ([id number])  Complications: No apparent anesthesia complications

## 2016-07-04 NOTE — Interval H&P Note (Signed)
History and Physical Interval Note:  07/04/2016 11:44 AM  [name]  has presented today for surgery, with the diagnosis of LEFT KNEE OA  The various methods of treatment have been discussed with the patient and family. After consideration of risks, benefits and other options for treatment, the patient has consented to  Procedure(s): LEFT TOTAL KNEE ARTHROPLASTY (Left) as a surgical intervention .  The patient's history has been reviewed, patient examined, no change in status, stable for surgery.  I have reviewed the patient's chart and labs.  Questions were answered to the patient's satisfaction.     Tammy Holland

## 2016-07-04 NOTE — Anesthesia Preprocedure Evaluation (Addendum)
Anesthesia Evaluation  Patient identified by MRN, date of birth, ID band Patient awake    Reviewed: Allergy & Precautions, H&P , NPO status , Patient's Chart, lab work & pertinent test results  Airway Mallampati: II  TM Distance: >3 FB Neck ROM: Full    Dental no notable dental hx. (+) Teeth Intact, Dental Advisory Given   Pulmonary asthma , former smoker,    Pulmonary exam normal breath sounds clear to auscultation       Cardiovascular hypertension, Pt. on medications  Rhythm:Regular Rate:Normal     Neuro/Psych negative neurological ROS  negative psych ROS   GI/Hepatic negative GI ROS, Neg liver ROS,   Endo/Other  negative endocrine ROS  Renal/GU negative Renal ROS  negative genitourinary   Musculoskeletal  (+) Arthritis , Osteoarthritis,    Abdominal   Peds  Hematology negative hematology ROS (+)   Anesthesia Other Findings   Reproductive/Obstetrics negative OB ROS                            Anesthesia Physical Anesthesia Plan  ASA: II  Anesthesia Plan: Spinal   Post-op Pain Management:    Induction: Intravenous  Airway Management Planned: Simple Face Mask  Additional Equipment:   Intra-op Plan:   Post-operative Plan:   Informed Consent: I have reviewed the patients History and Physical, chart, labs and discussed the procedure including the risks, benefits and alternatives for the proposed anesthesia with the patient or authorized representative who has indicated his/her understanding and acceptance.   Dental advisory given  Plan Discussed with: CRNA  Anesthesia Plan Comments:         Anesthesia Quick Evaluation

## 2016-07-04 NOTE — H&P (View-Only) (Signed)
Tammy Holland DOB: [birth year] Married / Language: English / Race: White Female Date of Admission:  07/04/2016 CC:  Left Knee Pain History of Present Illness  The patient is a 75 year old female who comes in for a preoperative History and Physical. The patient is scheduled for a left total knee arthroplasty to be performed by Dr. [name]. Aluisio, MD at [facility] on 07-04-2016. The patient is a 75 year old female who presented for follow up of their knee. The patient is being followed for their left knee pain and osteoarthritis. They are months out from Euflexxa series. Symptoms reported include: pain, aching and difficulty ambulating. The patient feels that they are doing poorly and report their pain level to be moderate. The following medication has been used for pain control: antiinflammatory medication and Tylenol. The patient has not gotten any relief of their symptoms with viscosupplementation (She states the Euflexxa series may have helped some; but not significantly). She did have some benefit in her right knee from the injection, but the left knee still is very problematic. She really would like to be more active, but the knees are preventing her from doing so. She does have some discomfort in the right knee, but the left is far worse. She is ready to proceed with surgery at this time. They have been treated conservatively in the past for the above stated problem and despite conservative measures, they continue to have progressive pain and severe functional limitations and dysfunction. They have failed non-operative management including home exercise, medications, and injections. It is felt that they would benefit from undergoing total joint replacement. Risks and benefits of the procedure have been discussed with the patient and they elect to proceed with surgery. There are no active contraindications to surgery such as ongoing infection or rapidly progressive neurological  disease.  Problem List/Past Medical  Primary osteoarthritis of both knees (M17.0)  Asthma  Breast Cancer  Chronic Obstructive Lung Disease  High blood pressure  Osteoarthritis  Hemorrhoids  Measles  Mumps  Rubella  Impaired Vision  Impaired Hearing  Tinnitus  Varicose veins   Allergies No Known Drug Allergies   Family History Cancer  Brother, Father, Maternal Grandfather. Depression  Brother. child Drug / Alcohol Addiction  Brother. First Degree Relatives  reported Heart Disease  Brother, Maternal Grandmother. Hypertension  Brother, Maternal Grandmother, Mother, Sister. Kidney disease  Maternal Grandfather. Liver Disease, Chronic  Brother, Father. Osteoarthritis  Brother, Maternal Grandfather, Maternal Grandmother, Mother, Sister.  Social History Children  2 Current drinker  11/28/2014: Currently drinks beer, wine and hard liquor 8-14 times per week Current work status  retired Furniture conservator/restorer daily; does running / walking and gym / [employer] Living situation  live with spouse Marital status  married No history of drug/alcohol rehab  Not under pain contract  Number of flights of stairs before winded  1 Tobacco / smoke exposure  11/28/2014: yes outdoors only Tobacco use  Former smoker. 11/28/2014 Advance Directives  Living Will, Healthcare POA  Medication History  Fluticasone Propionate (50MCG/ACT Suspension, Nasal) Active. Hydrochlorothiazide (12.5MG  Capsule, Oral) Active. Ventolin HFA (108 (90 Base)MCG/ACT Aerosol Soln, Inhalation) Active. Advair Diskus (Inhalation) Specific strength unknown - Active. Albuterol Sulfate (Inhalation) Specific strength unknown - Active. Allegra (Oral) Specific strength unknown - Active. PreserVision AREDS (Oral) Active. Glucosamine Complex (Oral) Active. Fish Oil Active. Biotin (Oral) Specific strength unknown - Active. Multivitamin (Oral) Active. Advil (200MG  Tablet, Oral as  needed) Active. Aleve (Oral as needed) Specific strength unknown -  Active. Aspirin EC (325MG  Tablet DR, Oral as needed) Active.   Past Surgical History Breast Biopsy  left Breast Mass; Local Excision  left Tonsillectomy  Date: 60. Partial Mastectomy  Date: 11/2007.   Review of Systems  General Not Present- Chills, Fatigue, Fever, Memory Loss, Night Sweats, Weight Gain and Weight Loss. Skin Not Present- Eczema, Hives, Itching, Lesions and Rash. HEENT Present- Hearing Loss. Not Present- Dentures, Double Vision, Headache, Tinnitus and Visual Loss. Respiratory Present- Shortness of breath with exertion. Not Present- Allergies, Chronic Cough, Coughing up blood and Shortness of breath at rest. Cardiovascular Not Present- Chest Pain, Difficulty Breathing Lying Down, Murmur, Palpitations, Racing/skipping heartbeats and Swelling. Gastrointestinal Present- Constipation. Not Present- Abdominal Pain, Bloody Stool, Diarrhea, Difficulty Swallowing, Heartburn, Jaundice, Loss of appetitie, Nausea and Vomiting. Female Genitourinary Not Present- Blood in Urine, Discharge, Flank Pain, Incontinence, Painful Urination, Urgency, Urinary frequency, Urinary Retention, Urinating at Night and Weak urinary stream. Musculoskeletal Present- Joint Pain, Morning Stiffness and Spasms. Not Present- Back Pain, Joint Swelling, Muscle Pain and Muscle Weakness. Neurological Not Present- Blackout spells, Difficulty with balance, Dizziness, Paralysis, Tremor and Weakness. Psychiatric Not Present- Insomnia.  Vitals  Weight: 125 lb Height: 64.75in Weight was reported by patient. Height was reported by patient. Body Surface Area: 1.62 m Body Mass Index: 20.96 kg/m  Pulse: 76 (Regular)  BP: 134/82 (Sitting, Right Arm, Standard)   Physical Exam General Mental Status -Alert, cooperative and good historian. General Appearance-pleasant, Not in acute distress. Orientation-Oriented X3. Build &  Nutrition-Well nourished and Well developed.  Head and Neck Head-normocephalic, atraumatic . Neck Global Assessment - supple, no bruit auscultated on the right, no bruit auscultated on the left.  Eye Vision-Wears corrective lenses. Pupil - Bilateral-Regular and Round. Motion - Bilateral-EOMI.  ENMT Note: Bilateral Hearing aids   Chest and Lung Exam Auscultation Breath sounds - clear at anterior chest wall and clear at posterior chest wall. Adventitious sounds - No Adventitious sounds.  Cardiovascular Auscultation Rhythm - Regular rate and rhythm. Heart Sounds - S1 WNL and S2 WNL. Murmurs & Other Heart Sounds - Auscultation of the heart reveals - No Murmurs.  Abdomen Palpation/Percussion Tenderness - Abdomen is non-tender to palpation. Rigidity (guarding) - Abdomen is soft. Auscultation Auscultation of the abdomen reveals - Bowel sounds normal.  Female Genitourinary Note: Not done, not pertinent to present illness   Musculoskeletal Note: On exam, she is alert and oriented, in no apparent distress. Evaluation of her left knee shows no effusion. Range is about 5 to 125, moderate crepitus on range of motion. Tenderness medial greater than lateral with no instability. Her right knee shows no effusion. Range is 0 to 125. Slight crepitus on range of motion, minimal medial tenderness. No lateral tenderness or instability.  RADIOGRAPHS We reviewed her x-rays. She has bone on bone medial and patellofemoral in the left knee. She has significant medial joint space narrowing in her right.  Assessment & Plan Primary osteoarthritis of left knee (M17.12)  Note:Surgical Plans: Left Total Knee Replacement  Disposition: Home  PCP: Dr. Kurtis Bushman - Patient has been seen preoperatively and felt to be stable for surgery.  Topical TXA  Anesthesia Issues: None  Signed electronically by Ok Edwards, III PA-C

## 2016-07-05 ENCOUNTER — Encounter (HOSPITAL_COMMUNITY): Payer: Self-pay | Admitting: Orthopedic Surgery

## 2016-07-05 LAB — BASIC METABOLIC PANEL
Anion gap: 6 (ref 5–15)
BUN: 15 mg/dL (ref 6–20)
CO2: 25 mmol/L (ref 22–32)
Calcium: 8.2 mg/dL — ABNORMAL LOW (ref 8.9–10.3)
Chloride: 107 mmol/L (ref 101–111)
Creatinine, Ser: 0.71 mg/dL (ref 0.44–1.00)
GFR calc Af Amer: >60 mL/min (ref >60)
GFR calc non Af Amer: >60 mL/min (ref >60)
Glucose, Bld: 111 mg/dL — ABNORMAL HIGH (ref 65–99)
Potassium: 3.5 mmol/L (ref 3.5–5.1)
Sodium: 138 mmol/L (ref 135–145)

## 2016-07-05 LAB — CBC
HCT: 31 % — ABNORMAL LOW (ref 36.0–46.0)
Hemoglobin: 10.6 g/dL — ABNORMAL LOW (ref 12.0–15.0)
MCH: 33.1 pg (ref 26.0–34.0)
MCHC: 34.2 g/dL (ref 30.0–36.0)
MCV: 96.9 fL (ref 78.0–100.0)
Platelets: 155 10*3/uL (ref 150–400)
RBC: 3.2 MIL/uL — ABNORMAL LOW (ref 3.87–5.11)
RDW: 13 % (ref 11.5–15.5)
WBC: 10.5 10*3/uL (ref 4.0–10.5)

## 2016-07-05 MED ORDER — METHOCARBAMOL 500 MG PO TABS: 500.0000 mg | ORAL_TABLET | Freq: Four times a day (QID) | ORAL | 0 refills | Status: DC | PRN

## 2016-07-05 MED ORDER — SODIUM CHLORIDE 0.9 % IV BOLUS (SEPSIS)
250.0000 mL | Freq: Once | INTRAVENOUS | Status: AC
  Administered 2016-07-05: 09:00:00 250 mL via INTRAVENOUS

## 2016-07-05 MED ORDER — RIVAROXABAN 10 MG PO TABS
10.0000 mg | ORAL_TABLET | Freq: Every day | ORAL | 0 refills | Status: DC
Start: 2016-07-06 — End: 2019-11-18

## 2016-07-05 MED ORDER — OXYCODONE HCL 5 MG PO TABS: 5.0000 mg | ORAL_TABLET | ORAL | 0 refills | Status: DC | PRN

## 2016-07-05 MED ORDER — TRAMADOL HCL 50 MG PO TABS: 50.0000 mg | ORAL_TABLET | Freq: Four times a day (QID) | ORAL | 0 refills | Status: DC | PRN

## 2016-07-05 NOTE — Progress Notes (Signed)
   Subjective: 1 Day Post-Op Procedure(s) (LRB): LEFT TOTAL KNEE ARTHROPLASTY (Left) Patient reports pain as mild.   Patient seen in rounds for Dr. Wynelle Link. Patient is well, and has had no acute complaints or problems We will start therapy today.  Plan is to go Home after hospital stay.  Objective: Vital signs in last 24 hours: Temp:  [97.5 F (36.4 C)-98.9 F (37.2 C)] 98.9 F (37.2 C) (12/12 2208) Pulse Rate:  [53-74] 72 (12/12 2208) Resp:  [16] 16 (12/12 1422) BP: (97-125)/(42-82) 119/82 (12/12 2208) SpO2:  [95 %-99 %] 96 % (12/12 2208)  Intake/Output from previous day:  Intake/Output Summary (Last 24 hours) at 07/05/16 2239 Last data filed at 07/05/16 2200  Gross per 24 hour  Intake          2138.75 ml  Output             2750 ml  Net          -611.25 ml    Intake/Output this shift: Total I/O In: 240 [P.O.:240] Out: 600 [Urine:600]  Labs:  Recent Labs  07/05/16 0509  HGB 10.6*    Recent Labs  07/05/16 0509  WBC 10.5  RBC 3.20*  HCT 31.0*  PLT 155    Recent Labs  07/05/16 0509  NA 138  K 3.5  CL 107  CO2 25  BUN 15  CREATININE 0.71  GLUCOSE 111*  CALCIUM 8.2*   No results for input(s): LABPT, INR in the last 72 hours.  EXAM General - Patient is Alert, Appropriate and Oriented Extremity - Neurovascular intact Sensation intact distally Intact pulses distally Dorsiflexion/Plantar flexion intact Dressing - dressing C/D/I Motor Function - intact, moving foot and toes well on exam.  Hemovac pulled without difficulty.  Past Medical History:  Diagnosis Date  . Arthritis    oa  . Asthma   . Cancer [facility]) 2009   BREAST left partial mastectomy and radiation  . Clinically significant macular edema with cotton wool spots    takes preservision for  . Cold within last month   spitting up clear mucous now, no fevers  . Dyspnea    with exertion only  . Hearing loss    both ears  . Hemorrhoids   . Hypertension     Assessment/Plan: 1 Day  Post-Op Procedure(s) (LRB): LEFT TOTAL KNEE ARTHROPLASTY (Left) Principal Problem:   OA (osteoarthritis) of knee  Estimated body mass index is 20.63 kg/m as calculated from the following:   Height as of this encounter: 5' 4.75" (1.645 m).   Weight as of this encounter: 55.8 kg (123 lb). Up with therapy Plan for discharge tomorrow Discharge home with home health  DVT Prophylaxis - Xarelto Weight-Bearing as tolerated to left leg D/C O2 and Pulse OX and try on Room Air  Arlee Muslim, PA-C Orthopaedic Surgery 07/05/2016, 10:39 PM

## 2016-07-05 NOTE — Discharge Summary (Signed)
Physician Discharge Summary   Patient ID: Tammy Holland MRN: [id number] DOB/AGE: [birth year] 74 y.o.  Admit date: 07/04/2016 Discharge date: 07/06/2016  Primary Diagnosis:  Osteoarthritis  Left knee(s) Admission Diagnoses:  Past Medical History:  Diagnosis Date  . Arthritis    oa  . Asthma   . Cancer [facility]) 2009   BREAST left partial mastectomy and radiation  . Clinically significant macular edema with cotton wool spots    takes preservision for  . Cold within last month   spitting up clear mucous now, no fevers  . Dyspnea    with exertion only  . Hearing loss    both ears  . Hemorrhoids   . Hypertension    Discharge Diagnoses:   Principal Problem:   OA (osteoarthritis) of knee  Estimated body mass index is 20.63 kg/m as calculated from the following:   Height as of this encounter: 5' 4.75" (1.645 m).   Weight as of this encounter: 55.8 kg (123 lb).  Procedure:  Procedure(s) (LRB): LEFT TOTAL KNEE ARTHROPLASTY (Left)   Consults: None  HPI: Tammy Holland is a 75 y.o. year old female with end stage OA of her left knee with progressively worsening pain and dysfunction. She has constant pain, with activity and at rest and significant functional deficits with difficulties even with ADLs. She has had extensive non-op management including analgesics, injections of cortisone and viscosupplements, and home exercise program, but remains in significant pain with significant dysfunction. Radiographs show bone on bone arthritis medial and patellofemoral. She presents now for left Total Knee Arthroplasty.   Laboratory Data: Admission on 07/04/2016  Component Date Value Ref Range Status  . WBC 07/05/2016 10.5  4.0 - 10.5 K/uL Final  . RBC 07/05/2016 3.20* 3.87 - 5.11 MIL/uL Final  . Hemoglobin 07/05/2016 10.6* 12.0 - 15.0 g/dL Final  . HCT 07/05/2016 31.0* 36.0 - 46.0 % Final  . MCV 07/05/2016 96.9  78.0 - 100.0 fL Final  . MCH 07/05/2016 33.1  26.0 - 34.0 pg Final  . MCHC  07/05/2016 34.2  30.0 - 36.0 g/dL Final  . RDW 07/05/2016 13.0  11.5 - 15.5 % Final  . Platelets 07/05/2016 155  150 - 400 K/uL Final  . Sodium 07/05/2016 138  135 - 145 mmol/L Final  . Potassium 07/05/2016 3.5  3.5 - 5.1 mmol/L Final  . Chloride 07/05/2016 107  101 - 111 mmol/L Final  . CO2 07/05/2016 25  22 - 32 mmol/L Final  . Glucose, Bld 07/05/2016 111* 65 - 99 mg/dL Final  . BUN 07/05/2016 15  6 - 20 mg/dL Final  . Creatinine, Ser 07/05/2016 0.71  0.44 - 1.00 mg/dL Final  . Calcium 07/05/2016 8.2* 8.9 - 10.3 mg/dL Final  . GFR calc non Af Amer 07/05/2016 >60  >60 mL/min Final  . GFR calc Af Amer 07/05/2016 >60  >60 mL/min Final   Comment: (NOTE) The eGFR has been calculated using the CKD EPI equation. This calculation has not been validated in all clinical situations. eGFR's persistently <60 mL/min signify possible Chronic Kidney Disease.   . Anion gap 07/05/2016 6  5 - 15 Final  . WBC 07/06/2016 11.7* 4.0 - 10.5 K/uL Final  . RBC 07/06/2016 3.29* 3.87 - 5.11 MIL/uL Final  . Hemoglobin 07/06/2016 10.9* 12.0 - 15.0 g/dL Final  . HCT 07/06/2016 31.6* 36.0 - 46.0 % Final  . MCV 07/06/2016 96.0  78.0 - 100.0 fL Final  . MCH 07/06/2016 33.1  26.0 - 34.0 pg  Final  . MCHC 07/06/2016 34.5  30.0 - 36.0 g/dL Final  . RDW 07/06/2016 13.0  11.5 - 15.5 % Final  . Platelets 07/06/2016 166  150 - 400 K/uL Final  . Sodium 07/06/2016 134* 135 - 145 mmol/L Final  . Potassium 07/06/2016 4.0  3.5 - 5.1 mmol/L Final  . Chloride 07/06/2016 102  101 - 111 mmol/L Final  . CO2 07/06/2016 27  22 - 32 mmol/L Final  . Glucose, Bld 07/06/2016 120* 65 - 99 mg/dL Final  . BUN 07/06/2016 14  6 - 20 mg/dL Final  . Creatinine, Ser 07/06/2016 0.61  0.44 - 1.00 mg/dL Final  . Calcium 07/06/2016 8.5* 8.9 - 10.3 mg/dL Final  . GFR calc non Af Amer 07/06/2016 >60  >60 mL/min Final  . GFR calc Af Amer 07/06/2016 >60  >60 mL/min Final   Comment: (NOTE) The eGFR has been calculated using the CKD EPI  equation. This calculation has not been validated in all clinical situations. eGFR's persistently <60 mL/min signify possible Chronic Kidney Disease.   Tammy Holland gap 07/06/2016 5  5 - 15 Final  Hospital Outpatient Visit on 06/27/2016  Component Date Value Ref Range Status  . MRSA, PCR 06/27/2016 NEGATIVE  NEGATIVE Final  . Staphylococcus aureus 06/27/2016 NEGATIVE  NEGATIVE Final   Comment:        The Xpert SA Assay (FDA approved for NASAL specimens in patients over 38 years of age), is one component of a comprehensive surveillance program.  Test performance has been validated by [facility] for patients greater than or equal to 62 year old. It is not intended to diagnose infection nor to guide or monitor treatment.   Marland Kitchen aPTT 06/27/2016 30  24 - 36 seconds Final  . WBC 06/27/2016 8.5  4.0 - 10.5 K/uL Final  . RBC 06/27/2016 4.14  3.87 - 5.11 MIL/uL Final  . Hemoglobin 06/27/2016 13.4  12.0 - 15.0 g/dL Final  . HCT 06/27/2016 40.1  36.0 - 46.0 % Final  . MCV 06/27/2016 96.9  78.0 - 100.0 fL Final  . MCH 06/27/2016 32.4  26.0 - 34.0 pg Final  . MCHC 06/27/2016 33.4  30.0 - 36.0 g/dL Final  . RDW 06/27/2016 13.0  11.5 - 15.5 % Final  . Platelets 06/27/2016 265  150 - 400 K/uL Final  . Sodium 06/27/2016 139  135 - 145 mmol/L Final  . Potassium 06/27/2016 4.9  3.5 - 5.1 mmol/L Final  . Chloride 06/27/2016 104  101 - 111 mmol/L Final  . CO2 06/27/2016 29  22 - 32 mmol/L Final  . Glucose, Bld 06/27/2016 103* 65 - 99 mg/dL Final  . BUN 06/27/2016 23* 6 - 20 mg/dL Final  . Creatinine, Ser 06/27/2016 0.81  0.44 - 1.00 mg/dL Final  . Calcium 06/27/2016 9.6  8.9 - 10.3 mg/dL Final  . Total Protein 06/27/2016 6.7  6.5 - 8.1 g/dL Final  . Albumin 06/27/2016 4.4  3.5 - 5.0 g/dL Final  . AST 06/27/2016 35  15 - 41 U/L Final  . ALT 06/27/2016 35  14 - 54 U/L Final  . Alkaline Phosphatase 06/27/2016 41  38 - 126 U/L Final  . Total Bilirubin 06/27/2016 0.8  0.3 - 1.2 mg/dL Final  . GFR calc  non Af Amer 06/27/2016 >60  >60 mL/min Final  . GFR calc Af Amer 06/27/2016 >60  >60 mL/min Final   Comment: (NOTE) The eGFR has been calculated using the CKD EPI equation. This calculation has  not been validated in all clinical situations. eGFR's persistently <60 mL/min signify possible Chronic Kidney Disease.   . Anion gap 06/27/2016 6  5 - 15 Final  . Prothrombin Time 06/27/2016 13.4  11.4 - 15.2 seconds Final  . INR 06/27/2016 1.02   Final  . ABO/RH(D) 07/04/2016 A POS   Final  . Antibody Screen 07/04/2016 NEG   Final  . Sample Expiration 07/04/2016 07/07/2016   Final  . Extend sample reason 07/04/2016 NO TRANSFUSIONS OR PREGNANCY IN THE PAST 3 MONTHS   Final  . Color, Urine 06/27/2016 YELLOW  YELLOW Final  . APPearance 06/27/2016 CLEAR  CLEAR Final  . Specific Gravity, Urine 06/27/2016 1.008  1.005 - 1.030 Final  . pH 06/27/2016 7.0  5.0 - 8.0 Final  . Glucose, UA 06/27/2016 NEGATIVE  NEGATIVE mg/dL Final  . Hgb urine dipstick 06/27/2016 NEGATIVE  NEGATIVE Final  . Bilirubin Urine 06/27/2016 NEGATIVE  NEGATIVE Final  . Ketones, ur 06/27/2016 NEGATIVE  NEGATIVE mg/dL Final  . Protein, ur 06/27/2016 NEGATIVE  NEGATIVE mg/dL Final  . Nitrite 06/27/2016 NEGATIVE  NEGATIVE Final  . Leukocytes, UA 06/27/2016 NEGATIVE  NEGATIVE Final  . ABO/RH(D) 06/27/2016 A POS   Final     X-Rays:No results found.  EKG:No orders found for this or any previous visit.   Hospital Course: Tammy Holland is a 75 y.o. who was admitted to [facility]. They were brought to the operating room on 07/04/2016 and underwent Procedure(s): LEFT TOTAL KNEE ARTHROPLASTY.  Patient tolerated the procedure well and was later transferred to the recovery room and then to the orthopaedic floor for postoperative care.  They were given PO and IV analgesics for pain control following their surgery.  They were given 24 hours of postoperative antibiotics of  Anti-infectives    Start     Dose/Rate Route Frequency  Ordered Stop   07/04/16 2000  ceFAZolin (ANCEF) IVPB 2g/100 mL premix     2 g 200 mL/hr over 30 Minutes Intravenous Every 6 hours 07/04/16 1555 07/05/16 0154   07/04/16 0952  ceFAZolin (ANCEF) IVPB 2g/100 mL premix     2 g 200 mL/hr over 30 Minutes Intravenous On call to O.R. 07/04/16 9150 07/04/16 1301     and started on DVT prophylaxis in the form of Xarelto.   PT and OT were ordered for total joint protocol.  Discharge planning consulted to help with postop disposition and equipment needs.  Patient had a decent night on the evening of surgery.  They started to get up OOB with therapy on day one. Hemovac drain was pulled without difficulty.  Continued to work with therapy into day two.  Dressing was changed on day two and the incision was healing well.   Patient was seen in rounds on day two and was ready to go home.  Discharge home with home health Diet - Cardiac diet Follow up - in 2 weeks Activity - WBAT Disposition - Home Condition Upon Discharge - Good D/C Meds - See DC Summary DVT Prophylaxis - Xarelto   Discharge Instructions    Call MD / Call 911    Complete by:  As directed    If you experience chest pain or shortness of breath, CALL 911 and be transported to the hospital emergency room.  If you develope a fever above 101 F, pus (white drainage) or increased drainage or redness at the wound, or calf pain, call your surgeon's office.   Change dressing    Complete  by:  As directed    Change dressing daily with sterile 4 x 4 inch gauze dressing and apply TED hose. Do not submerge the incision under water.   Constipation Prevention    Complete by:  As directed    Drink plenty of fluids.  Prune juice may be helpful.  You may use a stool softener, such as Colace (over the counter) 100 mg twice a day.  Use MiraLax (over the counter) for constipation as needed.   Diet - low sodium heart healthy    Complete by:  As directed    Discharge instructions    Complete by:  As directed     Pick up stool softner and laxative for home use following surgery while on pain medications. Do not submerge incision under water. Please use good hand washing techniques while changing dressing each day. May shower starting three days after surgery. Please use a clean towel to pat the incision dry following showers. Continue to use ice for pain and swelling after surgery. Do not use any lotions or creams on the incision until instructed by your surgeon.   Postoperative Constipation Protocol  Constipation - defined medically as fewer than three stools per week and severe constipation as less than one stool per week.  One of the most common issues patients have following surgery is constipation.  Even if you have a regular bowel pattern at home, your normal regimen is likely to be disrupted due to multiple reasons following surgery.  Combination of anesthesia, postoperative narcotics, change in appetite and fluid intake all can affect your bowels.  In order to avoid complications following surgery, here are some recommendations in order to help you during your recovery period.  Colace (docusate) - Pick up an over-the-counter form of Colace or another stool softener and take twice a day as long as you are requiring postoperative pain medications.  Take with a full glass of water daily.  If you experience loose stools or diarrhea, hold the colace until you stool forms back up.  If your symptoms do not get better within 1 week or if they get worse, check with your doctor.  Dulcolax (bisacodyl) - Pick up over-the-counter and take as directed by the product packaging as needed to assist with the movement of your bowels.  Take with a full glass of water.  Use this product as needed if not relieved by Colace only.   MiraLax (polyethylene glycol) - Pick up over-the-counter to have on hand.  MiraLax is a solution that will increase the amount of water in your bowels to assist with bowel movements.  Take as  directed and can mix with a glass of water, juice, soda, coffee, or tea.  Take if you go more than two days without a movement. Do not use MiraLax more than once per day. Call your doctor if you are still constipated or irregular after using this medication for 7 days in a row.  If you continue to have problems with postoperative constipation, please contact the office for further assistance and recommendations.  If you experience "the worst abdominal pain ever" or develop nausea or vomiting, please contact the office immediatly for further recommendations for treatment.   Take Xarelto for two and a half more weeks, then discontinue Xarelto. Once the patient has completed the Xarelto, they may resume the 325 mg Aspirin.   Do not put a pillow under the knee. Place it under the heel.    Complete by:  As directed  Do not sit on low chairs, stoools or toilet seats, as it may be difficult to get up from low surfaces    Complete by:  As directed    Driving restrictions    Complete by:  As directed    No driving until released by the physician.   Increase activity slowly as tolerated    Complete by:  As directed    Lifting restrictions    Complete by:  As directed    No lifting until released by the physician.   Patient may shower    Complete by:  As directed    You may shower without a dressing once there is no drainage.  Do not wash over the wound.  If drainage remains, do not shower until drainage stops.   TED hose    Complete by:  As directed    Use stockings (TED hose) for 3 weeks on both leg(s).  You may remove them at night for sleeping.   Weight bearing as tolerated    Complete by:  As directed    Laterality:  left   Extremity:  Lower       Medication List    STOP taking these medications   aspirin 325 MG tablet   Biotin 5000 MCG Tabs   Fish Oil 1000 MG Caps   GLUCOSAMINE CHONDR COMPLEX PO   ibuprofen 200 MG tablet Commonly known as:  ADVIL,MOTRIN   naproxen sodium  220 MG tablet Commonly known as:  ANAPROX   PRESERVISION AREDS PO   zinc gluconate 50 MG tablet     TAKE these medications   acetaminophen 500 MG tablet Commonly known as:  TYLENOL Take 1,000 mg by mouth at bedtime as needed (for pain).   ADVAIR HFA 115-21 MCG/ACT inhaler Generic drug:  fluticasone-salmeterol Inhale 2 puffs into the lungs 2 (two) times daily.   ALAWAY 0.025 % ophthalmic solution Generic drug:  ketotifen Place 1 drop into both eyes 2 (two) times daily as needed (for allergy eyes).   albuterol 108 (90 Base) MCG/ACT inhaler Commonly known as:  PROVENTIL HFA;VENTOLIN HFA Inhale 2 puffs into the lungs every 4 (four) hours as needed for wheezing or shortness of breath.   docusate sodium 100 MG capsule Commonly known as:  COLACE Take 100 mg by mouth daily as needed for mild constipation.   fexofenadine 180 MG tablet Commonly known as:  ALLEGRA Take 180 mg by mouth daily as needed for allergies.   fluticasone 50 MCG/ACT nasal spray Commonly known as:  FLONASE Place 1-2 sprays into both nostrils daily as needed for allergies.   guaiFENesin 600 MG 12 hr tablet Commonly known as:  MUCINEX Take 600 mg by mouth every 6 (six) hours as needed for cough (for cold/cough symptoms).   guaiFENesin-codeine 100-10 MG/5ML syrup Commonly known as:  ROBITUSSIN AC Take 5 mLs by mouth 3 (three) times daily as needed for cough.   hydrochlorothiazide 12.5 MG capsule Commonly known as:  MICROZIDE Take 12.5 mg by mouth daily.   methocarbamol 500 MG tablet Commonly known as:  ROBAXIN Take 1 tablet (500 mg total) by mouth every 6 (six) hours as needed for muscle spasms.   oxyCODONE 5 MG immediate release tablet Commonly known as:  Oxy IR/ROXICODONE Take 1-2 tablets (5-10 mg total) by mouth every 3 (three) hours as needed for moderate pain or severe pain.   oxymetazoline 0.05 % nasal spray Commonly known as:  AFRIN Place 1 spray into both nostrils 2 (two) times daily  as  needed for congestion.   rivaroxaban 10 MG Tabs tablet Commonly known as:  XARELTO Take 1 tablet (10 mg total) by mouth daily with breakfast. Take Xarelto for two and a half more weeks, then discontinue Xarelto. Once the patient has completed the Xarelto, they may resume the 325 mg Aspirin.   senna 8.6 MG tablet Commonly known as:  SENOKOT Take 1 tablet by mouth daily as needed for constipation.   sodium chloride 0.65 % Soln nasal spray Commonly known as:  OCEAN Place 1-2 sprays into both nostrils 3 (three) times daily as needed for congestion.   traMADol 50 MG tablet Commonly known as:  ULTRAM Take 1-2 tablets (50-100 mg total) by mouth every 6 (six) hours as needed (mild pain).            Durable Medical Equipment        Start     Ordered   07/05/16 1108  For home use only DME Walker rolling  Once    Question:  Patient needs a walker to treat with the following condition  Answer:  OA (osteoarthritis) of knee   07/05/16 1107     Follow-up [place] Follow up.   Why:  rolling walker Contact information: [place] [address] [phone]        Myrtis Ser, MD Follow up.   Specialty:  Internal Medicine       Gearlean Alf, MD. Schedule an appointment as soon as possible for a visit on 07/20/2016.   Specialty:  Orthopedic Surgery Why:  Will need to sse the PA on Wednesday 07/20/2016 in the afternoon Contact information: [address] [place] [address] [phone]           Signed: Arlee Muslim, PA-C Orthopaedic Surgery 07/06/2016, 8:23 AM

## 2016-07-05 NOTE — Evaluation (Signed)
Occupational Therapy Evaluation Patient Details Name: Tammy Holland MRN: [id number] DOB: 09/10/1940 Today's Date: 07/05/2016    History of Present Illness Pt is a 75 year old female s/p L TKA   Clinical Impression   Pt with decline in function and safety with ADLs and ADL mobility with decreased endurance, limited by pain. Pt would benefit from acute OT services to address impairments to increase level of function and safety    Follow Up Recommendations  No OT follow up;Supervision - Intermittent    Equipment Recommendations    none   Recommendations for Other Services       Precautions / Restrictions Precautions Precautions: Fall;Knee Precaution Comments: reviewed no pillow under knee Required Braces or Orthoses: Knee Immobilizer - Left Restrictions Weight Bearing Restrictions: No LLE Weight Bearing: Weight bearing as tolerated      Mobility Bed Mobility Overal bed mobility: Needs Assistance Bed Mobility: Supine to Sit     Supine to sit: Min guard     General bed mobility comments: pt up in recliner  Transfers Overall transfer level: Needs assistance Equipment used: Rolling walker (2 wheeled) Transfers: Sit to/from Stand Sit to Stand: Min guard         General transfer comment: verbal cues for UE and LE positioning    Balance Overall balance assessment: No apparent balance deficits (not formally assessed)                                          ADL Overall ADL's : Needs assistance/impaired     Grooming: Wash/dry face;Wash/dry hands;Standing;Min guard   Upper Body Bathing: Set up   Lower Body Bathing: Minimal assistance   Upper Body Dressing : Set up   Lower Body Dressing: Minimal assistance   Toilet Transfer: RW;Ambulation;Grab bars;Comfort height toilet;Min guard   Toileting- Clothing Manipulation and Hygiene: Min guard;Sit to/from stand       Functional mobility during ADLs: Min guard;Rolling walker General ADL  Comments: educated pt on use of reacher for LB dressing     Vision Vision Assessment?: No apparent visual deficits              Pertinent Vitals/Pain Pain Assessment: 0-10 Pain Score: 7  Pain Location: L knee Pain Descriptors / Indicators: Aching;Sore Pain Intervention(s): Monitored during session;Premedicated before session;Repositioned     Hand Dominance Right   Extremity/Trunk Assessment Upper Extremity Assessment Upper Extremity Assessment: Overall WFL for tasks assessed   Lower Extremity Assessment Lower Extremity Assessment: Defer to PT evaluation LLE Deficits / Details: pt able to perform SLR, approx 60* AAROM knee flexion, limited extension - lacking at least 10*   Cervical / Trunk Assessment Cervical / Trunk Assessment: Normal   Communication Communication Communication: HOH   Cognition Arousal/Alertness: Awake/alert Behavior During Therapy: WFL for tasks assessed/performed Overall Cognitive Status: Within Functional Limits for tasks assessed                     General Comments   py very pleasant and cooperative, husband supportive                 Home Living Family/patient expects to be discharged to:: Private residence Living Arrangements: Spouse/significant other   Type of Home: House Home Access: Stairs to enter Technical brewer of Steps: 4 Entrance Stairs-Rails: Right Home Layout: Two level;Able to live on main level with bedroom/bathroom Alternate  Level Stairs-Number of Steps: has 2 steps inside home Alternate Level Stairs-Rails: None Bathroom Shower/Tub: Occupational psychologist: Handicapped height     Home Equipment: Shower seat - built in;Hand held shower head   Additional Comments: plans to borrow RW from neighbor      Prior Functioning/Environment Level of Independence: Independent                 OT Problem List: Impaired balance (sitting and/or standing);Pain;Decreased activity  tolerance;Decreased knowledge of use of DME or AE   OT Treatment/Interventions: Self-care/ADL training;DME and/or AE instruction;Therapeutic activities;Patient/family education    OT Goals(Current goals can be found in the care plan section) Acute Rehab OT Goals Patient Stated Goal: go home OT Goal Formulation: With patient/family Time For Goal Achievement: 07/12/16 Potential to Achieve Goals: Good ADL Goals Pt Will Perform Lower Body Bathing: with min guard assist;with caregiver independent in assisting;sit to/from stand Pt Will Perform Lower Body Dressing: with min guard assist;with caregiver independent in assisting;sit to/from stand Pt Will Perform Tub/Shower Transfer: with min guard assist;with supervision;shower seat;rolling walker;ambulating  OT Frequency: Min 2X/week   Barriers to D/C:    no barriers                     End of Session Equipment Utilized During Treatment: Gait belt;Rolling walker CPM Left Knee CPM Left Knee: Off  Activity Tolerance: Patient tolerated treatment well Patient left: in chair;with call bell/phone within reach   Time: 1259-1324 OT Time Calculation (min): 25 min Charges:  OT General Charges $OT Visit: 1 Procedure OT Evaluation $OT Eval Moderate Complexity: 1 Procedure OT Treatments $Therapeutic Activity: 8-22 mins G-Codes:    Tammy Holland 07/05/2016, 1:38 PM

## 2016-07-05 NOTE — Care Management Note (Signed)
Case Management Note  Patient Details  Name: JONATHAN KIRKENDOLL MRN: [id number] Date of Birth: 1941/03/09  Subjective/Objective:                  Left  Total Knee Arthroplasty Action/Plan: Discharge planning Expected Discharge Date:  07/06/16               Expected Discharge Plan:  Home/Self Care  In-House Referral:     Discharge planning Services  CM Consult  Post Acute Care Choice:  Durable Medical Equipment Choice offered to:  Patient  DME Arranged:  [place] rolling DME Agency:  [place] Arranged:  NA [place] Agency:  NA  Status of Service:  Completed, signed off  If discussed at [place] of Stay Meetings, dates discussed:    Additional Comments: CM met with pt in room to confirm plan is for outpt PT; pt confirms.  CM notified [place] DME rep, to please deliver the rolling walker to room prior to discharge.  No other Cm needs were communicated. Dellie Catholic, RN 07/05/2016, 1:17 PM

## 2016-07-05 NOTE — Progress Notes (Signed)
   07/05/16 1500  PT Visit Information  Last PT Received On 07/05/16  Assistance Needed +1  History of Present Illness Pt is a 75 year old female s/p L TKA  Subjective Data  Subjective Pt ambulated again in hallway and then assisted back to bed.  Precautions  Precautions Fall;Knee  Precaution Comments able to perform SLR  Required Braces or Orthoses Knee Immobilizer - Left  Restrictions  LLE Weight Bearing WBAT  Pain Assessment  Pain Assessment 0-10  Pain Score 5  Pain Location L knee  Pain Descriptors / Indicators Aching;Sore  Pain Intervention(s) Limited activity within patient's tolerance;Monitored during session;Repositioned  Cognition  Arousal/Alertness Awake/alert  Behavior During Therapy WFL for tasks assessed/performed  Overall Cognitive Status Within Functional Limits for tasks assessed  Bed Mobility  Overal bed mobility Needs Assistance  Bed Mobility Sit to Supine  Supine to sit Supervision  Transfers  Overall transfer level Needs assistance  Equipment used Rolling walker (2 wheeled)  Transfers Sit to/from Stand  Sit to Stand Min guard  General transfer comment verbal cues for UE and LE positioning  Ambulation/Gait  Ambulation/Gait assistance Min guard  Ambulation Distance (Feet) 200 Feet  Assistive device Rolling walker (2 wheeled)  Gait Pattern/deviations Step-to pattern;Step-through pattern;Decreased stance time - left;Antalgic  General Gait Details verbal cues for sequence, RW positioning, posture  PT - End of Session  Activity Tolerance Patient tolerated treatment well  Patient left in bed;with call bell/phone within reach;with family/visitor present  PT - Assessment/Plan  PT Plan Current plan remains appropriate  PT Frequency (ACUTE ONLY) 7X/week  Follow Up Recommendations Outpatient PT  PT equipment Rolling walker with 5" wheels  PT Goal Progression  Progress towards PT goals Progressing toward goals  PT Time Calculation  PT Start Time (ACUTE ONLY)  1346  PT Stop Time (ACUTE ONLY) 1402  PT Time Calculation (min) (ACUTE ONLY) 16 min  PT General Charges  $$ ACUTE PT VISIT 1 Procedure  PT Treatments  $Gait Training 8-22 mins   Carmelia Bake, PT, DPT 07/05/2016 Pager: [phone]

## 2016-07-05 NOTE — Discharge Instructions (Addendum)
° °Dr. Frank Aluisio °Total Joint Specialist °[facility] °[address] °[place], [place] [address] °[phone] ° °TOTAL KNEE REPLACEMENT POSTOPERATIVE DIRECTIONS ° °Knee Rehabilitation, Guidelines Following Surgery  °Results after knee surgery are often greatly improved when you follow the exercise, range of motion and muscle strengthening exercises prescribed by your doctor. Safety measures are also important to protect the knee from further injury. Any time any of these exercises cause you to have increased pain or swelling in your knee joint, decrease the amount until you are comfortable again and slowly increase them. If you have problems or questions, call your caregiver or physical therapist for advice.  ° °HOME CARE INSTRUCTIONS  °Remove items at home which could result in a fall. This includes throw rugs or furniture in walking pathways.  °· ICE to the affected knee every three hours for 30 minutes at a time and then as needed for pain and swelling.  Continue to use ice on the knee for pain and swelling from surgery. You may notice swelling that will progress down to the foot and ankle.  This is normal after surgery.  Elevate the leg when you are not up walking on it.   °· Continue to use the breathing machine which will help keep your temperature down.  It is common for your temperature to cycle up and down following surgery, especially at night when you are not up moving around and exerting yourself.  The breathing machine keeps your lungs expanded and your temperature down. °· Do not place pillow under knee, focus on keeping the knee straight while resting ° °DIET °You may resume your previous home diet once your are discharged from the hospital. ° °DRESSING / WOUND CARE / SHOWERING °You may shower 3 days after surgery, but keep the wounds dry during showering.  You may use an occlusive plastic wrap (Press'n Seal for example), NO SOAKING/SUBMERGING IN THE BATHTUB.  If the  bandage gets wet, change with a clean dry gauze.  If the incision gets wet, pat the wound dry with a clean towel. °You may start showering once you are discharged home but do not submerge the incision under water. Just pat the incision dry and apply a dry gauze dressing on daily. °Change the surgical dressing daily and reapply a dry dressing each time. ° °ACTIVITY °Walk with your walker as instructed. °Use walker as long as suggested by your caregivers. °Avoid periods of inactivity such as sitting longer than an hour when not asleep. This helps prevent blood clots.  °You may resume a sexual relationship in one month or when given the OK by your doctor.  °You may return to work once you are cleared by your doctor.  °Do not drive a car for 6 weeks or until released by you surgeon.  °Do not drive while taking narcotics. ° °WEIGHT BEARING °Weight bearing as tolerated with assist device (walker, cane, etc) as directed, use it as long as suggested by your surgeon or therapist, typically at least 4-6 weeks. ° °POSTOPERATIVE CONSTIPATION PROTOCOL °Constipation - defined medically as fewer than three stools per week and severe constipation as less than one stool per week. ° °One of the most common issues patients have following surgery is constipation.  Even if you have a regular bowel pattern at home, your normal regimen is likely to be disrupted due to multiple reasons following surgery.  Combination of anesthesia, postoperative narcotics, change in appetite and fluid intake all can affect your bowels.    In order to avoid complications following surgery, here are some recommendations in order to help you during your recovery period. ° °Colace (docusate) - Pick up an over-the-counter form of Colace or another stool softener and take twice a day as long as you are requiring postoperative pain medications.  Take with a full glass of water daily.  If you experience loose stools or diarrhea, hold the colace until you stool forms  back up.  If your symptoms do not get better within 1 week or if they get worse, check with your doctor. ° °Dulcolax (bisacodyl) - Pick up over-the-counter and take as directed by the product packaging as needed to assist with the movement of your bowels.  Take with a full glass of water.  Use this product as needed if not relieved by Colace only.  ° °MiraLax (polyethylene glycol) - Pick up over-the-counter to have on hand.  MiraLax is a solution that will increase the amount of water in your bowels to assist with bowel movements.  Take as directed and can mix with a glass of water, juice, soda, coffee, or tea.  Take if you go more than two days without a movement. °Do not use MiraLax more than once per day. Call your doctor if you are still constipated or irregular after using this medication for 7 days in a row. ° °If you continue to have problems with postoperative constipation, please contact the office for further assistance and recommendations.  If you experience "the worst abdominal pain ever" or develop nausea or vomiting, please contact the office immediatly for further recommendations for treatment. ° °ITCHING ° If you experience itching with your medications, try taking only a single pain pill, or even half a pain pill at a time.  You can also use Benadryl over the counter for itching or also to help with sleep.  ° °TED HOSE STOCKINGS °Wear the elastic stockings on both legs for three weeks following surgery during the day but you may remove then at night for sleeping. ° °MEDICATIONS °See your medication summary on the “After Visit Summary” that the nursing staff will review with you prior to discharge.  You may have some home medications which will be placed on hold until you complete the course of blood thinner medication.  It is important for you to complete the blood thinner medication as prescribed by your surgeon.  Continue your approved medications as instructed at time of  discharge. ° °PRECAUTIONS °If you experience chest pain or shortness of breath - call 911 immediately for transfer to the hospital emergency department.  °If you develop a fever greater that 101 F, purulent drainage from wound, increased redness or drainage from wound, foul odor from the wound/dressing, or calf pain - CONTACT YOUR SURGEON.   °                                                °FOLLOW-UP APPOINTMENTS °Make sure you keep all of your appointments after your operation with your surgeon and caregivers. You should call the office at the above phone number and make an appointment for approximately two weeks after the date of your surgery or on the date instructed by your surgeon outlined in the "After Visit Summary". ° ° °RANGE OF MOTION AND STRENGTHENING EXERCISES  °Rehabilitation of the knee is important following a knee injury or   an operation. After just a few days of immobilization, the muscles of the thigh which control the knee become weakened and shrink (atrophy). Knee exercises are designed to build up the tone and strength of the thigh muscles and to improve knee motion. Often times heat used for twenty to thirty minutes before working out will loosen up your tissues and help with improving the range of motion but do not use heat for the first two weeks following surgery. These exercises can be done on a training (exercise) mat, on the floor, on a table or on a bed. Use what ever works the best and is most comfortable for you Knee exercises include:  °Leg Lifts - While your knee is still immobilized in a splint or cast, you can do straight leg raises. Lift the leg to 60 degrees, hold for 3 sec, and slowly lower the leg. Repeat 10-20 times 2-3 times daily. Perform this exercise against resistance later as your knee gets better.  °Quad and Hamstring Sets - Tighten up the muscle on the front of the thigh (Quad) and hold for 5-10 sec. Repeat this 10-20 times hourly. Hamstring sets are done by pushing the  foot backward against an object and holding for 5-10 sec. Repeat as with quad sets.  °· Leg Slides: Lying on your back, slowly slide your foot toward your buttocks, bending your knee up off the floor (only go as far as is comfortable). Then slowly slide your foot back down until your leg is flat on the floor again. °· Angel Wings: Lying on your back spread your legs to the side as far apart as you can without causing discomfort.  °A rehabilitation program following serious knee injuries can speed recovery and prevent re-injury in the future due to weakened muscles. Contact your doctor or a physical therapist for more information on knee rehabilitation.  ° °IF YOU ARE TRANSFERRED TO A SKILLED REHAB FACILITY °If the patient is transferred to a skilled rehab facility following release from the hospital, a list of the current medications will be sent to the facility for the patient to continue.  When discharged from the skilled rehab facility, please have the facility set up the patient's Home Health Physical Therapy prior to being released. Also, the skilled facility will be responsible for providing the patient with their medications at time of release from the facility to include their pain medication, the muscle relaxants, and their blood thinner medication. If the patient is still at the rehab facility at time of the two week follow up appointment, the skilled rehab facility will also need to assist the patient in arranging follow up appointment in our office and any transportation needs. ° °MAKE SURE YOU:  °Understand these instructions.  °Get help right away if you are not doing well or get worse.  ° ° °Pick up stool softner and laxative for home use following surgery while on pain medications. °Do not submerge incision under water. °Please use good hand washing techniques while changing dressing each day. °May shower starting three days after surgery. °Please use a clean towel to pat the incision dry following  showers. °Continue to use ice for pain and swelling after surgery. °Do not use any lotions or creams on the incision until instructed by your surgeon. ° °Take Xarelto for two and a half more weeks, then discontinue Xarelto. °Once the patient has completed the Xarelto, they may resume the 325 mg Aspirin. ° ° °Information on my medicine - XARELTO® (Rivaroxaban) ° °  This medication education was reviewed with me or my healthcare representative as part of my discharge preparation.  The pharmacist that spoke with me during my hospital stay was:  Glogovac,nikola, RPH ° °Why was Xarelto® prescribed for you? °Xarelto® was prescribed for you to reduce the risk of blood clots forming after orthopedic surgery. The medical term for these abnormal blood clots is venous thromboembolism (VTE). ° °What do you need to know about xarelto® ? °Take your Xarelto® ONCE DAILY at the same time every day. °You may take it either with or without food. ° °If you have difficulty swallowing the tablet whole, you may crush it and mix in applesauce just prior to taking your dose. ° °Take Xarelto® exactly as prescribed by your doctor and DO NOT stop taking Xarelto® without talking to the doctor who prescribed the medication.  Stopping without other VTE prevention medication to take the place of Xarelto® may increase your risk of developing a clot. ° °After discharge, you should have regular check-up appointments with your healthcare provider that is prescribing your Xarelto®.   ° °What do you do if you miss a dose? °If you miss a dose, take it as soon as you remember on the same day then continue your regularly scheduled once daily regimen the next day. Do not take two doses of Xarelto® on the same day.  ° °Important Safety Information °A possible side effect of Xarelto® is bleeding. You should call your healthcare provider right away if you experience any of the following: °? Bleeding from an injury or your nose that does not stop. °? Unusual  colored urine (red or dark brown) or unusual colored stools (red or black). °? Unusual bruising for unknown reasons. °? A serious fall or if you hit your head (even if there is no bleeding). ° °Some medicines may interact with Xarelto® and might increase your risk of bleeding while on Xarelto®. To help avoid this, consult your healthcare provider or pharmacist prior to using any new prescription or non-prescription medications, including herbals, vitamins, non-steroidal anti-inflammatory drugs (NSAIDs) and supplements. ° °This website has more information on Xarelto®: www.xarelto.com. ° ° °

## 2016-07-05 NOTE — Evaluation (Signed)
Physical Therapy Evaluation Patient Details Name: ROSAMARIA ZAKARIAN MRN: [id number] DOB: 04/17/1941 Today's Date: 07/05/2016   History of Present Illness  Pt is a 75 year old female s/p L TKA  Clinical Impression  Pt is s/p TKA resulting in the deficits listed below (see PT Problem List).  Pt will benefit from skilled PT to increase their independence and safety with mobility to allow discharge to the venue listed below.  Pt ambulated in hallway and performed LE exercises POD #1 and tolerated well.  Pt plans to d/c home with spouse.      Follow Up Recommendations Outpatient PT (plans for OPPT)    Equipment Recommendations  Rolling walker with 5" wheels (if borrow walker is incorrect size)    Recommendations for Other Services       Precautions / Restrictions Precautions Precautions: Fall;Knee Required Braces or Orthoses: Knee Immobilizer - Left Restrictions Weight Bearing Restrictions: No LLE Weight Bearing: Weight bearing as tolerated      Mobility  Bed Mobility Overal bed mobility: Needs Assistance Bed Mobility: Supine to Sit     Supine to sit: Min guard     General bed mobility comments: verbal cues for technique  Transfers Overall transfer level: Needs assistance Equipment used: Rolling walker (2 wheeled) Transfers: Sit to/from Stand Sit to Stand: Min guard         General transfer comment: verbal cues for UE and LE positioning  Ambulation/Gait Ambulation/Gait assistance: Min guard Ambulation Distance (Feet): 120 Feet Assistive device: Rolling walker (2 wheeled) Gait Pattern/deviations: Step-to pattern;Decreased stance time - left;Antalgic     General Gait Details: verbal cues for sequence, RW positioning, L heel contact  Stairs            Wheelchair Mobility    Modified Rankin (Stroke Patients Only)       Balance                                             Pertinent Vitals/Pain Pain Assessment: 0-10 Pain Score: 5   Pain Location: L knee Pain Descriptors / Indicators: Aching;Sore Pain Intervention(s): Limited activity within patient's tolerance;Monitored during session;Repositioned;Premedicated before session    [place] expects to be discharged to:: Private residence Living Arrangements: Spouse/significant other   Type of Home: House Home Access: Stairs to enter Entrance Stairs-Rails: Right Entrance Stairs-Number of Steps: 4 Home Layout: Two level;Able to live on main level with bedroom/bathroom   Additional Comments: plans to borrow RW from neighbor    Prior Function Level of Independence: Independent               Hand Dominance        Extremity/Trunk Assessment               Lower Extremity Assessment: LLE deficits/detail   LLE Deficits / Details: pt able to perform SLR, approx 60* AAROM knee flexion, limited extension - lacking at least 10*     Communication   Communication: HOH (hx hearing loss)  Cognition Arousal/Alertness: Awake/alert Behavior During Therapy: WFL for tasks assessed/performed Overall Cognitive Status: Within Functional Limits for tasks assessed                      General Comments      Exercises Total Joint Exercises Ankle Circles/Pumps: AROM;Both;10 reps Quad Sets: AROM;Both;10 reps Short Arc Quad: AROM;Left;10 reps  Heel Slides: AAROM;Left;10 reps Hip ABduction/ADduction: AROM;10 reps;Left Straight Leg Raises: AROM;Left;10 reps   Assessment/Plan    PT Assessment Patient needs continued PT services  PT Problem List Decreased strength;Decreased range of motion;Decreased mobility;Decreased knowledge of use of DME;Pain;Decreased knowledge of precautions          PT Treatment Interventions Functional mobility training;Stair training;Gait training;DME instruction;Therapeutic activities;Therapeutic exercise;Patient/family education    PT Goals (Current goals can be found in the Care Plan section)  Acute Rehab  PT Goals PT Goal Formulation: With patient Time For Goal Achievement: 07/08/16 Potential to Achieve Goals: Good    Frequency 7X/week   Barriers to discharge        Co-evaluation               End of Session Equipment Utilized During Treatment: Gait belt;Left knee immobilizer Activity Tolerance: Patient tolerated treatment well Patient left: in chair;with call bell/phone within reach;with nursing/sitter in room           Time: 1017-1042 PT Time Calculation (min) (ACUTE ONLY): 25 min   Charges:   PT Evaluation $PT Eval Low Complexity: 1 Procedure PT Treatments $Therapeutic Exercise: 8-22 mins   PT G Codes:        Evanie Buckle,KATHrine E 07/05/2016, 11:07 AM Carmelia Bake, PT, DPT 07/05/2016 Pager: [phone]

## 2016-07-06 LAB — BASIC METABOLIC PANEL
Anion gap: 5 (ref 5–15)
BUN: 14 mg/dL (ref 6–20)
CO2: 27 mmol/L (ref 22–32)
Calcium: 8.5 mg/dL — ABNORMAL LOW (ref 8.9–10.3)
Chloride: 102 mmol/L (ref 101–111)
Creatinine, Ser: 0.61 mg/dL (ref 0.44–1.00)
GFR calc Af Amer: >60 mL/min (ref >60)
GFR calc non Af Amer: >60 mL/min (ref >60)
Glucose, Bld: 120 mg/dL — ABNORMAL HIGH (ref 65–99)
Potassium: 4 mmol/L (ref 3.5–5.1)
Sodium: 134 mmol/L — ABNORMAL LOW (ref 135–145)

## 2016-07-06 LAB — CBC
HCT: 31.6 % — ABNORMAL LOW (ref 36.0–46.0)
Hemoglobin: 10.9 g/dL — ABNORMAL LOW (ref 12.0–15.0)
MCH: 33.1 pg (ref 26.0–34.0)
MCHC: 34.5 g/dL (ref 30.0–36.0)
MCV: 96 fL (ref 78.0–100.0)
Platelets: 166 10*3/uL (ref 150–400)
RBC: 3.29 MIL/uL — ABNORMAL LOW (ref 3.87–5.11)
RDW: 13 % (ref 11.5–15.5)
WBC: 11.7 10*3/uL — ABNORMAL HIGH (ref 4.0–10.5)

## 2016-07-06 NOTE — Progress Notes (Addendum)
Physical Therapy Treatment Patient Details Name: Tammy Holland MRN: [id number] DOB: [birth year] Today's Date: 07/06/2016    History of Present Illness Pt is a 75 year old female s/p L TKA    PT Comments    Progressing with mobility. Pt reported increased soreness/pain today. Practiced/reviewed exercises, gait training, and stair training. Issued HEP for pt to perform 2x/day until she begins OP PT. Discussed car transfer. All education completed. Ready to d/c from PT standpoint-made RN aware.   Follow Up Recommendations  Outpatient PT     Equipment Recommendations  Rolling walker with 5" wheels    Recommendations for Other Services       Precautions / Restrictions Precautions Precautions: Knee;Fall Required Braces or Orthoses: Knee Immobilizer - Left (used KI due to increased pain today) Knee Immobilizer - Left: Discontinue once straight leg raise with < 10 degree lag Restrictions Weight Bearing Restrictions: No LLE Weight Bearing: Weight bearing as tolerated    Mobility  Bed Mobility               General bed mobility comments: pt up in recliner  Transfers Overall transfer level: Needs assistance Equipment used: Rolling walker (2 wheeled) Transfers: Sit to/from Stand Sit to Stand: Supervision         General transfer comment: VCs hand/LE placement  Ambulation/Gait Ambulation/Gait assistance: Min guard Ambulation Distance (Feet): 100 Feet Assistive device: Rolling walker (2 wheeled)           Stairs Stairs: Yes   Stair Management: Step to pattern;Backwards;Sideways;With walker Number of Stairs: 2 (x3) General stair comments: x2 with 1 rail sideways, x1 with RW backwards. VCs safety, technique, sequence. Demonstrated backwards with walker with pt's husband when he arrived as well.   Wheelchair Mobility    Modified Rankin (Stroke Patients Only)       Balance                                    Cognition Arousal/Alertness:  Awake/alert Behavior During Therapy: WFL for tasks assessed/performed Overall Cognitive Status: Within Functional Limits for tasks assessed                      Exercises Total Joint Exercises Ankle Circles/Pumps: AROM;Both;15 reps;Seated Quad Sets: AROM;Left;10 reps;Seated Heel Slides: AAROM;Left;10 reps;Supine Hip ABduction/ADduction: AROM;Left;10 reps;Seated Straight Leg Raises: AROM;Left;10 reps;Supine Goniometric ROM: ~10-75 degrees    General Comments        Pertinent Vitals/Pain Pain Assessment: 0-10 Pain Score: 7  Pain Location: L knee with activity Pain Descriptors / Indicators: Aching;Sore Pain Intervention(s): Monitored during session;Repositioned;Ice applied    Home Living                      Prior Function            PT Goals (current goals can now be found in the care plan section) Progress towards PT goals: Progressing toward goals    Frequency    7X/week      PT Plan Current plan remains appropriate    Co-evaluation             End of Session Equipment Utilized During Treatment: Gait belt;Left knee immobilizer Activity Tolerance: Patient tolerated treatment well Patient left: in chair;with call bell/phone within reach;with family/visitor present     Time: [id number] PT Time Calculation (min) (ACUTE ONLY): 31 min  Charges:  $Gait Training: 8-22  mins $Therapeutic Exercise: 8-22 mins                    G Codes:      Tammy Holland, MPT Pager: [phone]

## 2016-07-06 NOTE — Progress Notes (Signed)
07/06/16  1030  Reviewed discharge instructions with patient. Patient verbalized understanding of discharge instructions. Copy of discharge instructions and prescriptions given to patient.

## 2016-07-06 NOTE — Progress Notes (Signed)
   Subjective: 2 Days Post-Op Procedure(s) (LRB): LEFT TOTAL KNEE ARTHROPLASTY (Left) Patient reports pain as mild.   Patient seen in rounds with Dr. Wynelle Link. Patient is well, and has had no acute complaints or problems Patient is ready to go home  Objective: Vital signs in last 24 hours: Temp:  [97.5 F (36.4 C)-99.1 F (37.3 C)] 99.1 F (37.3 C) (12/13 0651) Pulse Rate:  [58-77] 77 (12/13 0651) Resp:  [16] 16 (12/13 0651) BP: (115-138)/(42-82) 138/67 (12/13 0651) SpO2:  [96 %-99 %] 96 % (12/13 0651)  Intake/Output from previous day:  Intake/Output Summary (Last 24 hours) at 07/06/16 0735 Last data filed at 07/06/16 0651  Gross per 24 hour  Intake             2040 ml  Output             1850 ml  Net              190 ml    Intake/Output this shift: No intake/output data recorded.  Labs:  Recent Labs  07/05/16 0509 07/06/16 0421  HGB 10.6* 10.9*    Recent Labs  07/05/16 0509 07/06/16 0421  WBC 10.5 11.7*  RBC 3.20* 3.29*  HCT 31.0* 31.6*  PLT 155 166    Recent Labs  07/05/16 0509 07/06/16 0421  NA 138 134*  K 3.5 4.0  CL 107 102  CO2 25 27  BUN 15 14  CREATININE 0.71 0.61  GLUCOSE 111* 120*  CALCIUM 8.2* 8.5*   No results for input(s): LABPT, INR in the last 72 hours.  EXAM: General - Patient is Alert, Appropriate and Oriented Extremity - Neurovascular intact Sensation intact distally Dorsiflexion/Plantar flexion intact Incision - clean, dry, no drainage Motor Function - intact, moving foot and toes well on exam.   Assessment/Plan: 2 Days Post-Op Procedure(s) (LRB): LEFT TOTAL KNEE ARTHROPLASTY (Left) Procedure(s) (LRB): LEFT TOTAL KNEE ARTHROPLASTY (Left) Past Medical History:  Diagnosis Date  . Arthritis    oa  . Asthma   . Cancer [facility]) 2009   BREAST left partial mastectomy and radiation  . Clinically significant macular edema with cotton wool spots    takes preservision for  . Cold within last month   spitting up clear mucous  now, no fevers  . Dyspnea    with exertion only  . Hearing loss    both ears  . Hemorrhoids   . Hypertension    Principal Problem:   OA (osteoarthritis) of knee  Estimated body mass index is 20.63 kg/m as calculated from the following:   Height as of this encounter: 5' 4.75" (1.645 m).   Weight as of this encounter: 55.8 kg (123 lb). Up with therapy Discharge home with home health Diet - Cardiac diet Follow up - in 2 weeks Activity - WBAT Disposition - Home Condition Upon Discharge - Good D/C Meds - See DC Summary DVT Prophylaxis - Xarelto  Arlee Muslim, PA-C Orthopaedic Surgery 07/06/2016, 7:35 AM

## 2016-07-08 DIAGNOSIS — M25662 Stiffness of left knee, not elsewhere classified: Secondary | ICD-10-CM | POA: Diagnosis not present

## 2016-07-11 DIAGNOSIS — M25662 Stiffness of left knee, not elsewhere classified: Secondary | ICD-10-CM | POA: Diagnosis not present

## 2016-07-13 DIAGNOSIS — M25662 Stiffness of left knee, not elsewhere classified: Secondary | ICD-10-CM | POA: Diagnosis not present

## 2016-07-15 DIAGNOSIS — M25662 Stiffness of left knee, not elsewhere classified: Secondary | ICD-10-CM | POA: Diagnosis not present

## 2016-07-19 DIAGNOSIS — M25662 Stiffness of left knee, not elsewhere classified: Secondary | ICD-10-CM | POA: Diagnosis not present

## 2016-07-20 DIAGNOSIS — Z471 Aftercare following joint replacement surgery: Secondary | ICD-10-CM | POA: Diagnosis not present

## 2016-07-20 DIAGNOSIS — Z96652 Presence of left artificial knee joint: Secondary | ICD-10-CM | POA: Diagnosis not present

## 2016-07-21 DIAGNOSIS — M25662 Stiffness of left knee, not elsewhere classified: Secondary | ICD-10-CM | POA: Diagnosis not present

## 2016-07-26 DIAGNOSIS — M25662 Stiffness of left knee, not elsewhere classified: Secondary | ICD-10-CM | POA: Diagnosis not present

## 2016-07-28 DIAGNOSIS — M25662 Stiffness of left knee, not elsewhere classified: Secondary | ICD-10-CM | POA: Diagnosis not present

## 2016-08-01 DIAGNOSIS — M25662 Stiffness of left knee, not elsewhere classified: Secondary | ICD-10-CM | POA: Diagnosis not present

## 2016-08-03 DIAGNOSIS — M25662 Stiffness of left knee, not elsewhere classified: Secondary | ICD-10-CM | POA: Diagnosis not present

## 2016-08-05 DIAGNOSIS — M25662 Stiffness of left knee, not elsewhere classified: Secondary | ICD-10-CM | POA: Diagnosis not present

## 2016-08-08 DIAGNOSIS — M25662 Stiffness of left knee, not elsewhere classified: Secondary | ICD-10-CM | POA: Diagnosis not present

## 2016-08-09 DIAGNOSIS — Z96652 Presence of left artificial knee joint: Secondary | ICD-10-CM | POA: Diagnosis not present

## 2016-08-09 DIAGNOSIS — Z471 Aftercare following joint replacement surgery: Secondary | ICD-10-CM | POA: Diagnosis not present

## 2016-08-12 DIAGNOSIS — M25662 Stiffness of left knee, not elsewhere classified: Secondary | ICD-10-CM | POA: Diagnosis not present

## 2016-08-15 DIAGNOSIS — M25662 Stiffness of left knee, not elsewhere classified: Secondary | ICD-10-CM | POA: Diagnosis not present

## 2016-08-19 DIAGNOSIS — M25662 Stiffness of left knee, not elsewhere classified: Secondary | ICD-10-CM | POA: Diagnosis not present

## 2016-08-23 DIAGNOSIS — M25662 Stiffness of left knee, not elsewhere classified: Secondary | ICD-10-CM | POA: Diagnosis not present

## 2016-08-26 DIAGNOSIS — M25662 Stiffness of left knee, not elsewhere classified: Secondary | ICD-10-CM | POA: Diagnosis not present

## 2016-08-30 DIAGNOSIS — R05 Cough: Secondary | ICD-10-CM | POA: Diagnosis not present

## 2016-08-30 DIAGNOSIS — J45909 Unspecified asthma, uncomplicated: Secondary | ICD-10-CM | POA: Diagnosis not present

## 2016-08-30 DIAGNOSIS — R6883 Chills (without fever): Secondary | ICD-10-CM | POA: Diagnosis not present

## 2016-08-30 DIAGNOSIS — M47812 Spondylosis without myelopathy or radiculopathy, cervical region: Secondary | ICD-10-CM | POA: Diagnosis not present

## 2016-08-30 DIAGNOSIS — Z853 Personal history of malignant neoplasm of breast: Secondary | ICD-10-CM | POA: Diagnosis not present

## 2016-08-30 DIAGNOSIS — Z1389 Encounter for screening for other disorder: Secondary | ICD-10-CM | POA: Diagnosis not present

## 2016-08-30 DIAGNOSIS — Z0000 Encounter for general adult medical examination without abnormal findings: Secondary | ICD-10-CM | POA: Diagnosis not present

## 2016-08-30 DIAGNOSIS — I10 Essential (primary) hypertension: Secondary | ICD-10-CM | POA: Diagnosis not present

## 2016-08-31 DIAGNOSIS — M25662 Stiffness of left knee, not elsewhere classified: Secondary | ICD-10-CM | POA: Diagnosis not present

## 2016-09-02 DIAGNOSIS — M25662 Stiffness of left knee, not elsewhere classified: Secondary | ICD-10-CM | POA: Diagnosis not present

## 2016-09-06 DIAGNOSIS — Z471 Aftercare following joint replacement surgery: Secondary | ICD-10-CM | POA: Diagnosis not present

## 2016-09-06 DIAGNOSIS — Z96652 Presence of left artificial knee joint: Secondary | ICD-10-CM | POA: Diagnosis not present

## 2016-11-28 DIAGNOSIS — Z471 Aftercare following joint replacement surgery: Secondary | ICD-10-CM | POA: Diagnosis not present

## 2016-11-28 DIAGNOSIS — M170 Bilateral primary osteoarthritis of knee: Secondary | ICD-10-CM | POA: Diagnosis not present

## 2016-11-28 DIAGNOSIS — Z96652 Presence of left artificial knee joint: Secondary | ICD-10-CM | POA: Diagnosis not present

## 2016-11-28 DIAGNOSIS — M1711 Unilateral primary osteoarthritis, right knee: Secondary | ICD-10-CM | POA: Diagnosis not present

## 2017-01-12 ENCOUNTER — Other Ambulatory Visit: Payer: Self-pay | Admitting: Internal Medicine

## 2017-01-12 DIAGNOSIS — Z1231 Encounter for screening mammogram for malignant neoplasm of breast: Secondary | ICD-10-CM

## 2017-01-27 ENCOUNTER — Ambulatory Visit
Admission: RE | Admit: 2017-01-27 | Discharge: 2017-01-27 | Disposition: A | Discharge status: Home / Self Care | Payer: Medicare Other | Source: Ambulatory Visit | Attending: Internal Medicine | Admitting: Internal Medicine

## 2017-01-27 DIAGNOSIS — Z1231 Encounter for screening mammogram for malignant neoplasm of breast: Secondary | ICD-10-CM | POA: Diagnosis not present

## 2017-01-27 HISTORY — DX: Personal history of irradiation: Z92.3

## 2017-01-27 HISTORY — DX: Malignant neoplasm of unspecified site of unspecified female breast: C50.919

## 2017-02-06 DIAGNOSIS — H353131 Nonexudative age-related macular degeneration, bilateral, early dry stage: Secondary | ICD-10-CM | POA: Diagnosis not present

## 2017-02-06 DIAGNOSIS — H43811 Vitreous degeneration, right eye: Secondary | ICD-10-CM | POA: Diagnosis not present

## 2017-02-27 DIAGNOSIS — Z23 Encounter for immunization: Secondary | ICD-10-CM | POA: Diagnosis not present

## 2017-02-27 DIAGNOSIS — I10 Essential (primary) hypertension: Secondary | ICD-10-CM | POA: Diagnosis not present

## 2017-02-27 DIAGNOSIS — Z853 Personal history of malignant neoplasm of breast: Secondary | ICD-10-CM | POA: Diagnosis not present

## 2017-02-27 DIAGNOSIS — Z780 Asymptomatic menopausal state: Secondary | ICD-10-CM | POA: Diagnosis not present

## 2017-02-27 DIAGNOSIS — J45909 Unspecified asthma, uncomplicated: Secondary | ICD-10-CM | POA: Diagnosis not present

## 2017-03-09 DIAGNOSIS — S52571A Other intraarticular fracture of lower end of right radius, initial encounter for closed fracture: Secondary | ICD-10-CM | POA: Diagnosis not present

## 2017-03-24 DIAGNOSIS — S52571D Other intraarticular fracture of lower end of right radius, subsequent encounter for closed fracture with routine healing: Secondary | ICD-10-CM | POA: Diagnosis not present

## 2017-04-13 DIAGNOSIS — Z780 Asymptomatic menopausal state: Secondary | ICD-10-CM | POA: Diagnosis not present

## 2017-04-13 DIAGNOSIS — Z23 Encounter for immunization: Secondary | ICD-10-CM | POA: Diagnosis not present

## 2017-04-13 DIAGNOSIS — M8588 Other specified disorders of bone density and structure, other site: Secondary | ICD-10-CM | POA: Diagnosis not present

## 2017-04-21 DIAGNOSIS — S52571D Other intraarticular fracture of lower end of right radius, subsequent encounter for closed fracture with routine healing: Secondary | ICD-10-CM | POA: Diagnosis not present

## 2017-06-22 DIAGNOSIS — M1711 Unilateral primary osteoarthritis, right knee: Secondary | ICD-10-CM | POA: Diagnosis not present

## 2017-06-22 DIAGNOSIS — M25562 Pain in left knee: Secondary | ICD-10-CM | POA: Diagnosis not present

## 2017-09-04 DIAGNOSIS — Z0000 Encounter for general adult medical examination without abnormal findings: Secondary | ICD-10-CM | POA: Diagnosis not present

## 2017-09-04 DIAGNOSIS — J45909 Unspecified asthma, uncomplicated: Secondary | ICD-10-CM | POA: Diagnosis not present

## 2017-09-04 DIAGNOSIS — Z1389 Encounter for screening for other disorder: Secondary | ICD-10-CM | POA: Diagnosis not present

## 2017-09-04 DIAGNOSIS — I10 Essential (primary) hypertension: Secondary | ICD-10-CM | POA: Diagnosis not present

## 2017-09-04 DIAGNOSIS — M85859 Other specified disorders of bone density and structure, unspecified thigh: Secondary | ICD-10-CM | POA: Diagnosis not present

## 2017-09-04 DIAGNOSIS — J301 Allergic rhinitis due to pollen: Secondary | ICD-10-CM | POA: Diagnosis not present

## 2017-09-08 NOTE — Progress Notes (Signed)
closed

## 2017-09-26 DIAGNOSIS — R0602 Shortness of breath: Secondary | ICD-10-CM | POA: Diagnosis not present

## 2017-12-21 DIAGNOSIS — I8311 Varicose veins of right lower extremity with inflammation: Secondary | ICD-10-CM | POA: Diagnosis not present

## 2017-12-21 DIAGNOSIS — I8312 Varicose veins of left lower extremity with inflammation: Secondary | ICD-10-CM | POA: Diagnosis not present

## 2017-12-22 DIAGNOSIS — I8311 Varicose veins of right lower extremity with inflammation: Secondary | ICD-10-CM | POA: Diagnosis not present

## 2017-12-22 DIAGNOSIS — I8312 Varicose veins of left lower extremity with inflammation: Secondary | ICD-10-CM | POA: Diagnosis not present

## 2018-01-11 DIAGNOSIS — R490 Dysphonia: Secondary | ICD-10-CM | POA: Diagnosis not present

## 2018-01-11 DIAGNOSIS — J301 Allergic rhinitis due to pollen: Secondary | ICD-10-CM | POA: Diagnosis not present

## 2018-01-19 DIAGNOSIS — R490 Dysphonia: Secondary | ICD-10-CM | POA: Diagnosis not present

## 2018-01-19 DIAGNOSIS — B351 Tinea unguium: Secondary | ICD-10-CM | POA: Diagnosis not present

## 2018-01-19 DIAGNOSIS — L03031 Cellulitis of right toe: Secondary | ICD-10-CM | POA: Diagnosis not present

## 2018-02-02 DIAGNOSIS — I8312 Varicose veins of left lower extremity with inflammation: Secondary | ICD-10-CM | POA: Diagnosis not present

## 2018-02-02 DIAGNOSIS — I8311 Varicose veins of right lower extremity with inflammation: Secondary | ICD-10-CM | POA: Diagnosis not present

## 2018-02-06 ENCOUNTER — Other Ambulatory Visit: Payer: Self-pay | Admitting: Internal Medicine

## 2018-02-06 DIAGNOSIS — Z1231 Encounter for screening mammogram for malignant neoplasm of breast: Secondary | ICD-10-CM

## 2018-02-27 ENCOUNTER — Ambulatory Visit
Admission: RE | Admit: 2018-02-27 | Discharge: 2018-02-27 | Disposition: A | Discharge status: Home / Self Care | Payer: Medicare Other | Source: Ambulatory Visit | Attending: Internal Medicine | Admitting: Internal Medicine

## 2018-02-27 DIAGNOSIS — Z1231 Encounter for screening mammogram for malignant neoplasm of breast: Secondary | ICD-10-CM

## 2018-03-07 DIAGNOSIS — I8312 Varicose veins of left lower extremity with inflammation: Secondary | ICD-10-CM | POA: Diagnosis not present

## 2018-03-09 DIAGNOSIS — I8312 Varicose veins of left lower extremity with inflammation: Secondary | ICD-10-CM | POA: Diagnosis not present

## 2018-03-13 DIAGNOSIS — J45909 Unspecified asthma, uncomplicated: Secondary | ICD-10-CM | POA: Diagnosis not present

## 2018-03-13 DIAGNOSIS — I10 Essential (primary) hypertension: Secondary | ICD-10-CM | POA: Diagnosis not present

## 2018-03-23 DIAGNOSIS — Z853 Personal history of malignant neoplasm of breast: Secondary | ICD-10-CM | POA: Diagnosis not present

## 2018-03-23 DIAGNOSIS — J45909 Unspecified asthma, uncomplicated: Secondary | ICD-10-CM | POA: Diagnosis not present

## 2018-03-23 DIAGNOSIS — M47812 Spondylosis without myelopathy or radiculopathy, cervical region: Secondary | ICD-10-CM | POA: Diagnosis not present

## 2018-03-23 DIAGNOSIS — I10 Essential (primary) hypertension: Secondary | ICD-10-CM | POA: Diagnosis not present

## 2018-03-30 DIAGNOSIS — Z853 Personal history of malignant neoplasm of breast: Secondary | ICD-10-CM | POA: Diagnosis not present

## 2018-03-30 DIAGNOSIS — I10 Essential (primary) hypertension: Secondary | ICD-10-CM | POA: Diagnosis not present

## 2018-03-30 DIAGNOSIS — M47812 Spondylosis without myelopathy or radiculopathy, cervical region: Secondary | ICD-10-CM | POA: Diagnosis not present

## 2018-03-30 DIAGNOSIS — J45909 Unspecified asthma, uncomplicated: Secondary | ICD-10-CM | POA: Diagnosis not present

## 2018-04-04 DIAGNOSIS — I8311 Varicose veins of right lower extremity with inflammation: Secondary | ICD-10-CM | POA: Diagnosis not present

## 2018-04-06 DIAGNOSIS — I8311 Varicose veins of right lower extremity with inflammation: Secondary | ICD-10-CM | POA: Diagnosis not present

## 2018-04-17 DIAGNOSIS — Z23 Encounter for immunization: Secondary | ICD-10-CM | POA: Diagnosis not present

## 2018-05-15 DIAGNOSIS — I8312 Varicose veins of left lower extremity with inflammation: Secondary | ICD-10-CM | POA: Diagnosis not present

## 2018-05-29 DIAGNOSIS — I8311 Varicose veins of right lower extremity with inflammation: Secondary | ICD-10-CM | POA: Diagnosis not present

## 2018-06-13 DIAGNOSIS — I83812 Varicose veins of left lower extremities with pain: Secondary | ICD-10-CM | POA: Diagnosis not present

## 2018-06-13 DIAGNOSIS — I8312 Varicose veins of left lower extremity with inflammation: Secondary | ICD-10-CM | POA: Diagnosis not present

## 2018-07-04 DIAGNOSIS — I83811 Varicose veins of right lower extremities with pain: Secondary | ICD-10-CM | POA: Diagnosis not present

## 2018-07-04 DIAGNOSIS — I8311 Varicose veins of right lower extremity with inflammation: Secondary | ICD-10-CM | POA: Diagnosis not present

## 2018-07-04 DIAGNOSIS — M7981 Nontraumatic hematoma of soft tissue: Secondary | ICD-10-CM | POA: Diagnosis not present

## 2018-07-16 DIAGNOSIS — H524 Presbyopia: Secondary | ICD-10-CM | POA: Diagnosis not present

## 2018-07-16 DIAGNOSIS — H2513 Age-related nuclear cataract, bilateral: Secondary | ICD-10-CM | POA: Diagnosis not present

## 2018-07-16 DIAGNOSIS — H52221 Regular astigmatism, right eye: Secondary | ICD-10-CM | POA: Diagnosis not present

## 2018-07-16 DIAGNOSIS — H31001 Unspecified chorioretinal scars, right eye: Secondary | ICD-10-CM | POA: Diagnosis not present

## 2018-07-16 DIAGNOSIS — H11153 Pinguecula, bilateral: Secondary | ICD-10-CM | POA: Diagnosis not present

## 2018-07-16 DIAGNOSIS — H43811 Vitreous degeneration, right eye: Secondary | ICD-10-CM | POA: Diagnosis not present

## 2018-07-16 DIAGNOSIS — H35363 Drusen (degenerative) of macula, bilateral: Secondary | ICD-10-CM | POA: Diagnosis not present

## 2018-07-16 DIAGNOSIS — H5203 Hypermetropia, bilateral: Secondary | ICD-10-CM | POA: Diagnosis not present

## 2018-07-27 DIAGNOSIS — M7981 Nontraumatic hematoma of soft tissue: Secondary | ICD-10-CM | POA: Diagnosis not present

## 2018-07-27 DIAGNOSIS — I8312 Varicose veins of left lower extremity with inflammation: Secondary | ICD-10-CM | POA: Diagnosis not present

## 2018-08-14 DIAGNOSIS — I8311 Varicose veins of right lower extremity with inflammation: Secondary | ICD-10-CM | POA: Diagnosis not present

## 2018-08-14 DIAGNOSIS — M7981 Nontraumatic hematoma of soft tissue: Secondary | ICD-10-CM | POA: Diagnosis not present

## 2018-09-06 DIAGNOSIS — J301 Allergic rhinitis due to pollen: Secondary | ICD-10-CM | POA: Diagnosis not present

## 2018-09-06 DIAGNOSIS — Z1389 Encounter for screening for other disorder: Secondary | ICD-10-CM | POA: Diagnosis not present

## 2018-09-06 DIAGNOSIS — I10 Essential (primary) hypertension: Secondary | ICD-10-CM | POA: Diagnosis not present

## 2018-09-06 DIAGNOSIS — M85859 Other specified disorders of bone density and structure, unspecified thigh: Secondary | ICD-10-CM | POA: Diagnosis not present

## 2018-09-06 DIAGNOSIS — Z0000 Encounter for general adult medical examination without abnormal findings: Secondary | ICD-10-CM | POA: Diagnosis not present

## 2018-09-06 DIAGNOSIS — Z853 Personal history of malignant neoplasm of breast: Secondary | ICD-10-CM | POA: Diagnosis not present

## 2018-09-06 DIAGNOSIS — M47812 Spondylosis without myelopathy or radiculopathy, cervical region: Secondary | ICD-10-CM | POA: Diagnosis not present

## 2018-09-06 DIAGNOSIS — J45909 Unspecified asthma, uncomplicated: Secondary | ICD-10-CM | POA: Diagnosis not present

## 2018-09-21 DIAGNOSIS — Z853 Personal history of malignant neoplasm of breast: Secondary | ICD-10-CM | POA: Diagnosis not present

## 2018-09-21 DIAGNOSIS — I10 Essential (primary) hypertension: Secondary | ICD-10-CM | POA: Diagnosis not present

## 2018-09-21 DIAGNOSIS — M47812 Spondylosis without myelopathy or radiculopathy, cervical region: Secondary | ICD-10-CM | POA: Diagnosis not present

## 2018-09-21 DIAGNOSIS — J45909 Unspecified asthma, uncomplicated: Secondary | ICD-10-CM | POA: Diagnosis not present

## 2019-01-16 DIAGNOSIS — I8312 Varicose veins of left lower extremity with inflammation: Secondary | ICD-10-CM | POA: Diagnosis not present

## 2019-01-16 DIAGNOSIS — I8311 Varicose veins of right lower extremity with inflammation: Secondary | ICD-10-CM | POA: Diagnosis not present

## 2019-01-17 DIAGNOSIS — M1711 Unilateral primary osteoarthritis, right knee: Secondary | ICD-10-CM | POA: Diagnosis not present

## 2019-01-17 DIAGNOSIS — Z96652 Presence of left artificial knee joint: Secondary | ICD-10-CM | POA: Diagnosis not present

## 2019-01-17 DIAGNOSIS — Z471 Aftercare following joint replacement surgery: Secondary | ICD-10-CM | POA: Diagnosis not present

## 2019-01-22 DIAGNOSIS — I8312 Varicose veins of left lower extremity with inflammation: Secondary | ICD-10-CM | POA: Diagnosis not present

## 2019-01-22 DIAGNOSIS — I83811 Varicose veins of right lower extremities with pain: Secondary | ICD-10-CM | POA: Diagnosis not present

## 2019-01-22 DIAGNOSIS — I83812 Varicose veins of left lower extremities with pain: Secondary | ICD-10-CM | POA: Diagnosis not present

## 2019-01-22 DIAGNOSIS — I8311 Varicose veins of right lower extremity with inflammation: Secondary | ICD-10-CM | POA: Diagnosis not present

## 2019-02-05 DIAGNOSIS — I8312 Varicose veins of left lower extremity with inflammation: Secondary | ICD-10-CM | POA: Diagnosis not present

## 2019-02-12 DIAGNOSIS — I8312 Varicose veins of left lower extremity with inflammation: Secondary | ICD-10-CM | POA: Diagnosis not present

## 2019-02-19 ENCOUNTER — Other Ambulatory Visit: Payer: Self-pay | Admitting: Internal Medicine

## 2019-02-19 DIAGNOSIS — Z1231 Encounter for screening mammogram for malignant neoplasm of breast: Secondary | ICD-10-CM

## 2019-03-11 ENCOUNTER — Other Ambulatory Visit: Payer: Self-pay | Admitting: Internal Medicine

## 2019-03-11 DIAGNOSIS — Z853 Personal history of malignant neoplasm of breast: Secondary | ICD-10-CM | POA: Diagnosis not present

## 2019-03-11 DIAGNOSIS — M19049 Primary osteoarthritis, unspecified hand: Secondary | ICD-10-CM | POA: Diagnosis not present

## 2019-03-11 DIAGNOSIS — I10 Essential (primary) hypertension: Secondary | ICD-10-CM | POA: Diagnosis not present

## 2019-03-11 DIAGNOSIS — N63 Unspecified lump in unspecified breast: Secondary | ICD-10-CM | POA: Diagnosis not present

## 2019-03-11 DIAGNOSIS — N6320 Unspecified lump in the left breast, unspecified quadrant: Secondary | ICD-10-CM

## 2019-03-11 DIAGNOSIS — J45909 Unspecified asthma, uncomplicated: Secondary | ICD-10-CM | POA: Diagnosis not present

## 2019-03-14 ENCOUNTER — Ambulatory Visit
Admission: RE | Admit: 2019-03-14 | Discharge: 2019-03-14 | Disposition: A | Discharge status: Home / Self Care | Payer: Medicare Other | Source: Ambulatory Visit | Attending: Internal Medicine | Admitting: Internal Medicine

## 2019-03-14 ENCOUNTER — Other Ambulatory Visit: Payer: Self-pay

## 2019-03-14 DIAGNOSIS — R928 Other abnormal and inconclusive findings on diagnostic imaging of breast: Secondary | ICD-10-CM | POA: Diagnosis not present

## 2019-03-14 DIAGNOSIS — N6320 Unspecified lump in the left breast, unspecified quadrant: Secondary | ICD-10-CM

## 2019-03-14 DIAGNOSIS — N6321 Unspecified lump in the left breast, upper outer quadrant: Secondary | ICD-10-CM | POA: Diagnosis not present

## 2019-03-15 DIAGNOSIS — I8312 Varicose veins of left lower extremity with inflammation: Secondary | ICD-10-CM | POA: Diagnosis not present

## 2019-03-26 DIAGNOSIS — Z23 Encounter for immunization: Secondary | ICD-10-CM | POA: Diagnosis not present

## 2019-04-29 DIAGNOSIS — Z853 Personal history of malignant neoplasm of breast: Secondary | ICD-10-CM | POA: Diagnosis not present

## 2019-04-29 DIAGNOSIS — D171 Benign lipomatous neoplasm of skin and subcutaneous tissue of trunk: Secondary | ICD-10-CM | POA: Diagnosis not present

## 2019-05-24 DIAGNOSIS — I10 Essential (primary) hypertension: Secondary | ICD-10-CM | POA: Diagnosis not present

## 2019-05-24 DIAGNOSIS — M19049 Primary osteoarthritis, unspecified hand: Secondary | ICD-10-CM | POA: Diagnosis not present

## 2019-05-24 DIAGNOSIS — M47812 Spondylosis without myelopathy or radiculopathy, cervical region: Secondary | ICD-10-CM | POA: Diagnosis not present

## 2019-05-24 DIAGNOSIS — J45909 Unspecified asthma, uncomplicated: Secondary | ICD-10-CM | POA: Diagnosis not present

## 2019-05-24 DIAGNOSIS — Z853 Personal history of malignant neoplasm of breast: Secondary | ICD-10-CM | POA: Diagnosis not present

## 2019-06-11 DIAGNOSIS — L821 Other seborrheic keratosis: Secondary | ICD-10-CM | POA: Diagnosis not present

## 2019-06-11 DIAGNOSIS — D1739 Benign lipomatous neoplasm of skin and subcutaneous tissue of other sites: Secondary | ICD-10-CM | POA: Diagnosis not present

## 2019-06-11 DIAGNOSIS — L818 Other specified disorders of pigmentation: Secondary | ICD-10-CM | POA: Diagnosis not present

## 2019-06-11 DIAGNOSIS — D1801 Hemangioma of skin and subcutaneous tissue: Secondary | ICD-10-CM | POA: Diagnosis not present

## 2019-06-11 DIAGNOSIS — L570 Actinic keratosis: Secondary | ICD-10-CM | POA: Diagnosis not present

## 2019-06-11 DIAGNOSIS — L814 Other melanin hyperpigmentation: Secondary | ICD-10-CM | POA: Diagnosis not present

## 2019-08-02 DIAGNOSIS — Z853 Personal history of malignant neoplasm of breast: Secondary | ICD-10-CM | POA: Diagnosis not present

## 2019-08-02 DIAGNOSIS — D171 Benign lipomatous neoplasm of skin and subcutaneous tissue of trunk: Secondary | ICD-10-CM | POA: Diagnosis not present

## 2019-08-08 DIAGNOSIS — Z23 Encounter for immunization: Secondary | ICD-10-CM | POA: Diagnosis not present

## 2019-08-13 DIAGNOSIS — L821 Other seborrheic keratosis: Secondary | ICD-10-CM | POA: Diagnosis not present

## 2019-08-13 DIAGNOSIS — D1801 Hemangioma of skin and subcutaneous tissue: Secondary | ICD-10-CM | POA: Diagnosis not present

## 2019-08-13 DIAGNOSIS — L814 Other melanin hyperpigmentation: Secondary | ICD-10-CM | POA: Diagnosis not present

## 2019-08-13 DIAGNOSIS — D1739 Benign lipomatous neoplasm of skin and subcutaneous tissue of other sites: Secondary | ICD-10-CM | POA: Diagnosis not present

## 2019-08-13 DIAGNOSIS — L570 Actinic keratosis: Secondary | ICD-10-CM | POA: Diagnosis not present

## 2019-09-04 DIAGNOSIS — Z23 Encounter for immunization: Secondary | ICD-10-CM | POA: Diagnosis not present

## 2019-09-18 ENCOUNTER — Other Ambulatory Visit: Payer: Self-pay | Admitting: Internal Medicine

## 2019-09-18 DIAGNOSIS — Z1389 Encounter for screening for other disorder: Secondary | ICD-10-CM | POA: Diagnosis not present

## 2019-09-18 DIAGNOSIS — K2100 Gastro-esophageal reflux disease with esophagitis, without bleeding: Secondary | ICD-10-CM | POA: Diagnosis not present

## 2019-09-18 DIAGNOSIS — Z853 Personal history of malignant neoplasm of breast: Secondary | ICD-10-CM | POA: Diagnosis not present

## 2019-09-18 DIAGNOSIS — M85859 Other specified disorders of bone density and structure, unspecified thigh: Secondary | ICD-10-CM

## 2019-09-18 DIAGNOSIS — Z1211 Encounter for screening for malignant neoplasm of colon: Secondary | ICD-10-CM | POA: Diagnosis not present

## 2019-09-18 DIAGNOSIS — J45909 Unspecified asthma, uncomplicated: Secondary | ICD-10-CM | POA: Diagnosis not present

## 2019-09-18 DIAGNOSIS — J301 Allergic rhinitis due to pollen: Secondary | ICD-10-CM | POA: Diagnosis not present

## 2019-09-18 DIAGNOSIS — I10 Essential (primary) hypertension: Secondary | ICD-10-CM | POA: Diagnosis not present

## 2019-09-18 DIAGNOSIS — Z0000 Encounter for general adult medical examination without abnormal findings: Secondary | ICD-10-CM | POA: Diagnosis not present

## 2019-09-18 DIAGNOSIS — Z800 Family history of malignant neoplasm of digestive organs: Secondary | ICD-10-CM | POA: Diagnosis not present

## 2019-09-20 DIAGNOSIS — Z1211 Encounter for screening for malignant neoplasm of colon: Secondary | ICD-10-CM | POA: Diagnosis not present

## 2019-10-02 DIAGNOSIS — H35363 Drusen (degenerative) of macula, bilateral: Secondary | ICD-10-CM | POA: Diagnosis not present

## 2019-10-08 DIAGNOSIS — L818 Other specified disorders of pigmentation: Secondary | ICD-10-CM | POA: Diagnosis not present

## 2019-10-08 DIAGNOSIS — D1739 Benign lipomatous neoplasm of skin and subcutaneous tissue of other sites: Secondary | ICD-10-CM | POA: Diagnosis not present

## 2019-10-08 DIAGNOSIS — L570 Actinic keratosis: Secondary | ICD-10-CM | POA: Diagnosis not present

## 2019-10-08 DIAGNOSIS — D1801 Hemangioma of skin and subcutaneous tissue: Secondary | ICD-10-CM | POA: Diagnosis not present

## 2019-10-08 DIAGNOSIS — L814 Other melanin hyperpigmentation: Secondary | ICD-10-CM | POA: Diagnosis not present

## 2019-10-08 DIAGNOSIS — L821 Other seborrheic keratosis: Secondary | ICD-10-CM | POA: Diagnosis not present

## 2019-10-16 DIAGNOSIS — R079 Chest pain, unspecified: Secondary | ICD-10-CM | POA: Diagnosis not present

## 2019-10-16 DIAGNOSIS — K219 Gastro-esophageal reflux disease without esophagitis: Secondary | ICD-10-CM | POA: Diagnosis not present

## 2019-11-11 ENCOUNTER — Ambulatory Visit
Admission: RE | Admit: 2019-11-11 | Discharge: 2019-11-11 | Disposition: A | Discharge status: Home / Self Care | Payer: Medicare Other | Source: Ambulatory Visit | Attending: Internal Medicine | Admitting: Internal Medicine

## 2019-11-11 ENCOUNTER — Other Ambulatory Visit: Payer: Self-pay

## 2019-11-11 DIAGNOSIS — M85859 Other specified disorders of bone density and structure, unspecified thigh: Secondary | ICD-10-CM

## 2019-11-11 DIAGNOSIS — Z780 Asymptomatic menopausal state: Secondary | ICD-10-CM | POA: Diagnosis not present

## 2019-11-12 DIAGNOSIS — K5900 Constipation, unspecified: Secondary | ICD-10-CM | POA: Diagnosis not present

## 2019-11-12 DIAGNOSIS — Z800 Family history of malignant neoplasm of digestive organs: Secondary | ICD-10-CM | POA: Diagnosis not present

## 2019-11-12 DIAGNOSIS — K219 Gastro-esophageal reflux disease without esophagitis: Secondary | ICD-10-CM | POA: Diagnosis not present

## 2019-11-14 DIAGNOSIS — Z96652 Presence of left artificial knee joint: Secondary | ICD-10-CM | POA: Diagnosis not present

## 2019-11-14 DIAGNOSIS — M1712 Unilateral primary osteoarthritis, left knee: Secondary | ICD-10-CM | POA: Diagnosis not present

## 2019-11-14 DIAGNOSIS — M170 Bilateral primary osteoarthritis of knee: Secondary | ICD-10-CM | POA: Diagnosis not present

## 2019-11-14 DIAGNOSIS — M1711 Unilateral primary osteoarthritis, right knee: Secondary | ICD-10-CM | POA: Diagnosis not present

## 2019-11-17 NOTE — Progress Notes (Signed)
Cardiology Cardiology Note    Date:  11/18/2019   ID:  Tammy Holland, DOB Jun 02, 1941, MRN [id number]  PCP:  Aldean Jewett, MD  Cardiologist:  Fransico Him, MD   Chief Complaint  Patient presents with  . Chest Pain    History of Present Illness:  Tammy Holland is a 79 y.o. female who is being seen today for the evaluation of chest pain at the request of Leeroy Cha,*.  This is a 79yo female with a hx of asthma, breast CA, and HTN.  She recently saw her PCP and complained of  episodes of chest pain. She recently saw her PCP in March and mentioned that she was having episodes of chest pain.  She tells me that the pain started back Thanksgiving day last year.  She has started taking liquid Tumeric and that evening she developed sharp left sided CP worse with deep breathing.  There was no radiation of the CP and no associated Nausea, SOB or diaphoresis.  It lasted several hours and resolved after she went to sleep.  Since then she has had episodes off and on.  She stopped taking the Tumeric and was her PCP who felt she had GERD and started her on a PPI.  She has continued to have episodes of intermittent CP now with walking or climbing hills or stairs but still sharp.  She has a remote hx of tobacco use and has a family hx of CAD in her brother who had a CABG.    Past Medical History:  Diagnosis Date  . Arthritis    oa  . Asthma   . Breast cancer ([place])   . Cancer [facility]) 2009   BREAST left partial mastectomy and radiation  . Clinically significant macular edema with cotton wool spots    takes preservision for  . Cold within last month   spitting up clear mucous now, no fevers  . Dyspnea    with exertion only  . Hearing loss    both ears  . Hemorrhoids   . Hypertension   . Personal history of radiation therapy     Past Surgical History:  Procedure Laterality Date  . BREAST LUMPECTOMY    . colonscopy    . TONSILLECTOMY AND ADENOIDECTOMY  age 67  . TOTAL KNEE  ARTHROPLASTY Left 07/04/2016   Procedure: LEFT TOTAL KNEE ARTHROPLASTY;  Surgeon: Gaynelle Arabian, MD;  Location: [facility] ORS;  Service: Orthopedics;  Laterality: Left;  . varicose vein saline injections      Current Medications: Current Meds  Medication Sig  . acetaminophen (TYLENOL) 500 MG tablet Take 1,000 mg by mouth at bedtime as needed (for pain).  Pamella Pert HFA [id number] MCG/ACT inhaler Inhale 2 puffs into the lungs 2 (two) times daily.  Marland Kitchen albuterol (PROVENTIL HFA;VENTOLIN HFA) 108 (90 Base) MCG/ACT inhaler Inhale 2 puffs into the lungs every 4 (four) hours as needed for wheezing or shortness of breath.  . Biotin 5000 MCG SUBL Place 1 tablet under the tongue daily.  . bisacodyl (DULCOLAX) 5 MG EC tablet Take 5 mg by mouth daily as needed for moderate constipation.  . calcium carbonate (TUMS - DOSED IN MG ELEMENTAL CALCIUM) 500 MG chewable tablet Chew 1 tablet by mouth as needed for indigestion or heartburn.  . fexofenadine (ALLEGRA) 180 MG tablet Take 180 mg by mouth daily as needed for allergies.  . fluticasone (FLONASE) 50 MCG/ACT nasal spray Place 1-2 sprays into both nostrils daily as needed for allergies.  Marland Kitchen  guaiFENesin (MUCINEX) 600 MG 12 hr tablet Take 600 mg by mouth every 6 (six) hours as needed for cough (for cold/cough symptoms).  Marland Kitchen guaiFENesin-codeine (ROBITUSSIN AC) 100-10 MG/5ML syrup Take 5 mLs by mouth 3 (three) times daily as needed for cough.  . hydrochlorothiazide (MICROZIDE) 12.5 MG capsule Take 12.5 mg by mouth daily.   Marland Kitchen ketotifen (ALAWAY) 0.025 % ophthalmic solution Place 1 drop into both eyes 2 (two) times daily as needed (for allergy eyes).  . Misc Natural Products (GLUCOSAMINE CHOND CMP TRIPLE PO) Take 2 tablets by mouth daily.  . Multiple Vitamin (MULTIVITAMIN) capsule Take 1 capsule by mouth daily.  . Multiple Vitamins-Minerals (PRESERVISION AREDS 2 PO) Take 1 tablet by mouth in the morning and at bedtime.  . Omega-3 Fatty Acids (FISH OIL) 1200 MG CAPS Take 1 capsule by  mouth daily.  Marland Kitchen omeprazole (PRILOSEC) 20 MG capsule Take 20 mg by mouth every morning.  Marland Kitchen oxymetazoline (AFRIN) 0.05 % nasal spray Place 1 spray into both nostrils 2 (two) times daily as needed for congestion.  . polyethylene glycol (MIRALAX / GLYCOLAX) 17 g packet Take 17 g by mouth daily as needed.  . psyllium (METAMUCIL) 58.6 % packet Take 1 packet by mouth as needed.  . sodium chloride (OCEAN) 0.65 % SOLN nasal spray Place 1-2 sprays into both nostrils 3 (three) times daily as needed for congestion.    Allergies:   Patient has no known allergies.   Social History   Socioeconomic History  . Marital status: Married    Spouse name: Not on file  . Number of children: Not on file  . Years of education: Not on file  . Highest education level: Not on file  Occupational History  . Not on file  Tobacco Use  . Smoking status: Former Smoker    Years: 30.00    Types: Cigarettes    Quit date: 07/25/1976    Years since quitting: 43.3  . Smokeless tobacco: Never Used  . Tobacco comment: social smoking only  Substance and Sexual Activity  . Alcohol use: Yes    Alcohol/week: 2.0 standard drinks    Types: 2 Glasses of wine per week    Comment: per day.  . Drug use: No  . Sexual activity: Not on file  Other Topics Concern  . Not on file  Social History Narrative  . Not on file   Social Determinants of Health   Financial Resource Strain:   . Difficulty of Paying Living Expenses:   Food Insecurity:   . Worried About Charity fundraiser in the Last Year:   . Arboriculturist in the Last Year:   Transportation Needs:   . Film/video editor (Medical):   Marland Kitchen Lack of Transportation (Non-Medical):   Physical Activity:   . Days of Exercise per Week:   . Minutes of Exercise per Session:   Stress:   . Feeling of Stress :   Social Connections:   . Frequency of Communication with Friends and Family:   . Frequency of Social Gatherings with Friends and Family:   . Attends Religious  Services:   . Active Member of Clubs or Organizations:   . Attends Archivist Meetings:   Marland Kitchen Marital Status:      Family History:  The patient's family history includes Alcohol abuse in her brother; Breast cancer in her cousin; Cancer in her father; Colon cancer in her sister; Coronary artery disease in her brother; Depression in her brother;  Emphysema in her mother; Hepatitis in her father; Hypertension in her sister; Stroke in her maternal grandmother.   ROS:   Please see the history of present illness.    ROS All other systems reviewed and are negative.  No flowsheet data found.     PHYSICAL EXAM:   VS:  BP 128/82   Pulse 73   Ht 5' 4.5" (1.638 m)   Wt 130 lb 3.2 oz (59.1 kg)   SpO2 98%   BMI 22.00 kg/m    GEN: Well nourished, well developed, in no acute distress  HEENT: normal  Neck: no JVD, carotid bruits, or masses Cardiac: RRR; no murmurs, rubs, or gallops,no edema.  Intact distal pulses bilaterally.  Respiratory:  clear to auscultation bilaterally, normal work of breathing GI: soft, nontender, nondistended, + BS MS: no deformity or atrophy  Skin: warm and dry, no rash Neuro:  Alert and Oriented x 3, Strength and sensation are intact Psych: euthymic mood, full affect  Wt Readings from Last 3 Encounters:  11/18/19 130 lb 3.2 oz (59.1 kg)  07/04/16 123 lb (55.8 kg)  06/27/16 125 lb (56.7 kg)      Studies/Labs Reviewed:   EKG:  EKG is ordered today.  The ekg ordered today demonstrates NSR with nonspecific ST abnormality  Recent Labs: No results found for requested labs within last 8760 hours.   Lipid Panel No results found for: CHOL, TRIG, HDL, CHOLHDL, VLDL, LDLCALC, LDLDIRECT  Additional studies/ records that were reviewed today include:  Office noted from PCP    ASSESSMENT:    1. Other chest pain   2. Benign essential HTN      PLAN:  In order of problems listed above:  1. Chest pain -she has typical and atypical components -it was  initially with deep breathing but now she gets it with walking and climbing stairs and hills -there is no radiation of the discomfort which is sharp -she has a hx of tobacco use as well as family hx of CAD -her EKG shows nonspecific ST abnormality -I have recommended a coronary CTA to define coronary anatomy and assess for CAD  2.  HTN -BP controlled -continue HCTZ 12.5mg  daily    Medication Adjustments/Labs and Tests Ordered: Current medicines are reviewed at length with the patient today.  Concerns regarding medicines are outlined above.  Medication changes, Labs and Tests ordered today are listed in the Patient Instructions below.  There are no Patient Instructions on file for this visit.   Signed, Fransico Him, MD  11/18/2019 9:16 AM    [facility] [place], [place], [place]  [address] Phone: [phone]; Fax: [phone]

## 2019-11-18 ENCOUNTER — Other Ambulatory Visit: Payer: Self-pay

## 2019-11-18 ENCOUNTER — Encounter: Payer: Self-pay | Admitting: Cardiology

## 2019-11-18 ENCOUNTER — Ambulatory Visit (INDEPENDENT_AMBULATORY_CARE_PROVIDER_SITE_OTHER): Payer: Medicare Other | Admitting: Cardiology

## 2019-11-18 VITALS — BP 128/82 | HR 73 | Ht 64.5 in | Wt 130.2 lb

## 2019-11-18 DIAGNOSIS — I10 Essential (primary) hypertension: Secondary | ICD-10-CM | POA: Diagnosis not present

## 2019-11-18 DIAGNOSIS — R072 Precordial pain: Secondary | ICD-10-CM | POA: Diagnosis not present

## 2019-11-18 DIAGNOSIS — R0789 Other chest pain: Secondary | ICD-10-CM | POA: Diagnosis not present

## 2019-11-18 DIAGNOSIS — R931 Abnormal findings on diagnostic imaging of heart and coronary circulation: Secondary | ICD-10-CM

## 2019-11-18 MED ORDER — METOPROLOL TARTRATE 50 MG PO TABS: 50.0000 mg | ORAL_TABLET | Freq: Once | ORAL | 0 refills | Status: DC

## 2019-11-18 NOTE — Patient Instructions (Addendum)
Medication Instructions:  Your physician recommends that you continue on your current medications as directed. Please refer to the Current Medication list given to you today.  *If you need a refill on your cardiac medications before your next appointment, please call your pharmacy*   Follow-Up: At [facility], you and your health needs are our priority.  As part of our continuing mission to provide you with exceptional heart care, we have created designated Provider Care Teams.  These Care Teams include your primary Cardiologist (physician) and Advanced Practice Providers (APPs -  Physician Assistants and Nurse Practitioners) who all work together to provide you with the care you need, when you need it.  We recommend signing up for the patient portal called "MyChart".  Sign up information is provided on this After Visit Summary.  MyChart is used to connect with patients for Virtual Visits (Telemedicine).  Patients are able to view lab/test results, encounter notes, upcoming appointments, etc.  Non-urgent messages can be sent to your provider as well.   To learn more about what you can do with MyChart, go to [email or web address].    Follow up with Dr. Radford Pax as needed based on results of testing.    Other Instructions Your cardiac CT will be scheduled at one of the below locations:   [facility] [address] [place], [place] [address] [phone]   Please arrive at the [facility] main entrance of [facility] 30 minutes prior to test start time. Proceed to the [facility] Radiology Department (first floor) to check-in and test prep.   Please follow these instructions carefully (unless otherwise directed):   On the Night Before the Test: . Be sure to Drink plenty of water. . Do not consume any caffeinated/decaffeinated beverages or chocolate 12 hours prior to your test. . Do not take any antihistamines 12 hours prior to your test. . If you take Metformin  do not take 24 hours prior to test. .  On the Day of the Test: . Drink plenty of water. Do not drink any water within one hour of the test. . Do not eat any food 4 hours prior to the test. . You may take your regular medications prior to the test.  . Take metoprolol (Lopressor) two hours prior to test. . FEMALES- please wear underwire-free bra if available      After the Test: . Drink plenty of water. . After receiving IV contrast, you may experience a mild flushed feeling. This is normal. . On occasion, you may experience a mild rash up to 24 hours after the test. This is not dangerous. If this occurs, you can take Benadryl 25 mg and increase your fluid intake. . If you experience trouble breathing, this can be serious. If it is severe call 911 IMMEDIATELY. If it is mild, please call our office.   Once we have confirmed authorization from your insurance company, we will call you to set up a date and time for your test.   For non-scheduling related questions, please contact the cardiac imaging nurse navigator should you have any questions/concerns: Marchia Bond, RN Navigator Cardiac Imaging [facility] [phone] office  For scheduling needs, including cancellations and rescheduling, please call [phone].

## 2019-11-29 DIAGNOSIS — Z1159 Encounter for screening for other viral diseases: Secondary | ICD-10-CM | POA: Diagnosis not present

## 2019-12-05 ENCOUNTER — Other Ambulatory Visit: Payer: Medicare Other

## 2019-12-05 ENCOUNTER — Other Ambulatory Visit: Payer: Self-pay

## 2019-12-05 ENCOUNTER — Telehealth (HOSPITAL_COMMUNITY): Payer: Self-pay | Admitting: *Deleted

## 2019-12-05 DIAGNOSIS — R0789 Other chest pain: Secondary | ICD-10-CM

## 2019-12-05 DIAGNOSIS — I10 Essential (primary) hypertension: Secondary | ICD-10-CM | POA: Diagnosis not present

## 2019-12-05 DIAGNOSIS — R072 Precordial pain: Secondary | ICD-10-CM

## 2019-12-05 NOTE — Telephone Encounter (Signed)

## 2019-12-06 ENCOUNTER — Ambulatory Visit (HOSPITAL_COMMUNITY)
Admission: RE | Admit: 2019-12-06 | Discharge: 2019-12-06 | Disposition: A | Discharge status: Home / Self Care | Payer: Medicare Other | Source: Ambulatory Visit | Attending: Cardiology | Admitting: Cardiology

## 2019-12-06 DIAGNOSIS — R072 Precordial pain: Secondary | ICD-10-CM | POA: Insufficient documentation

## 2019-12-06 LAB — BASIC METABOLIC PANEL WITH GFR
BUN/Creatinine Ratio: 15 (ref 12–28)
BUN: 11 mg/dL (ref 8–27)
CO2: 23 mmol/L (ref 20–29)
Calcium: 9.6 mg/dL (ref 8.7–10.3)
Chloride: 106 mmol/L (ref 96–106)
Creatinine, Ser: 0.75 mg/dL (ref 0.57–1.00)
GFR calc Af Amer: 89 mL/min/{1.73_m2}
GFR calc non Af Amer: 77 mL/min/{1.73_m2}
Glucose: 94 mg/dL (ref 65–99)
Potassium: 4.8 mmol/L (ref 3.5–5.2)
Sodium: 143 mmol/L (ref 134–144)

## 2019-12-06 MED ORDER — NITROGLYCERIN 0.4 MG SL SUBL
SUBLINGUAL_TABLET | SUBLINGUAL | Status: AC
  Filled 2019-12-06: qty 2

## 2019-12-06 MED ORDER — NITROGLYCERIN 0.4 MG SL SUBL
0.8000 mg | SUBLINGUAL_TABLET | Freq: Once | SUBLINGUAL | Status: AC
  Administered 2019-12-06: 0.8 mg via SUBLINGUAL

## 2019-12-06 MED ORDER — IOHEXOL 350 MG/ML SOLN
100.0000 mL | Freq: Once | INTRAVENOUS | Status: AC | PRN
  Administered 2019-12-06: 100 mL via INTRAVENOUS

## 2019-12-08 ENCOUNTER — Ambulatory Visit (HOSPITAL_COMMUNITY)
Admission: RE | Admit: 2019-12-08 | Discharge: 2019-12-08 | Disposition: A | Discharge status: Home / Self Care | Payer: Medicare Other | Source: Ambulatory Visit | Attending: Cardiology | Admitting: Cardiology

## 2019-12-08 ENCOUNTER — Other Ambulatory Visit: Payer: Self-pay

## 2019-12-08 DIAGNOSIS — R072 Precordial pain: Secondary | ICD-10-CM | POA: Insufficient documentation

## 2019-12-08 DIAGNOSIS — R931 Abnormal findings on diagnostic imaging of heart and coronary circulation: Secondary | ICD-10-CM | POA: Diagnosis not present

## 2019-12-10 ENCOUNTER — Telehealth: Payer: Self-pay | Admitting: Cardiology

## 2019-12-10 MED ORDER — ASPIRIN EC 81 MG PO TBEC
81.0000 mg | DELAYED_RELEASE_TABLET | Freq: Every day | ORAL | 3 refills | Status: DC
Start: 2019-12-10 — End: 2020-09-15

## 2019-12-10 NOTE — Telephone Encounter (Signed)
    Pt is calling back for her CT results

## 2019-12-10 NOTE — Telephone Encounter (Signed)
Tammy Margarita, MD  Leeroy Cha, MD; Antonieta Iba, RN  Coronary CTA showed a mild plaque in the mid LAD. This has been submitted for FFR analysis. Please get a copy of her last FLP from PCP and start her on ASA 81mg  daily.    The patient has been notified of the result and verbalized understanding.  All questions (if any) were answered. Antonieta Iba, RN 12/10/2019 5:20 PM

## 2019-12-11 ENCOUNTER — Telehealth: Payer: Self-pay | Admitting: Cardiology

## 2019-12-11 DIAGNOSIS — R072 Precordial pain: Secondary | ICD-10-CM

## 2019-12-11 NOTE — Telephone Encounter (Signed)
Yes please

## 2019-12-11 NOTE — Telephone Encounter (Signed)
Lipid results received from [place] at [place]. Per note from [place], patient's last lipid labs were done in 2016. None since then. 12/11/19 vlm

## 2019-12-12 NOTE — Addendum Note (Signed)
Addended by: Antonieta Iba on: 12/12/2019 09:57 AM   Modules accepted: Orders

## 2019-12-12 NOTE — Telephone Encounter (Signed)
Spoke with the patient and she will come in on 05/25 for FLP and ALT

## 2019-12-12 NOTE — Telephone Encounter (Signed)
Patient returning call. She states to call her back on her cell phone.

## 2019-12-12 NOTE — Telephone Encounter (Signed)
Left message for patient to call back  

## 2019-12-15 DIAGNOSIS — R072 Precordial pain: Secondary | ICD-10-CM | POA: Diagnosis not present

## 2019-12-15 DIAGNOSIS — R931 Abnormal findings on diagnostic imaging of heart and coronary circulation: Secondary | ICD-10-CM | POA: Diagnosis not present

## 2019-12-15 DIAGNOSIS — Z6822 Body mass index (BMI) 22.0-22.9, adult: Secondary | ICD-10-CM | POA: Diagnosis not present

## 2019-12-17 ENCOUNTER — Other Ambulatory Visit: Payer: Self-pay | Admitting: Cardiology

## 2019-12-17 ENCOUNTER — Other Ambulatory Visit: Payer: Medicare Other | Admitting: *Deleted

## 2019-12-17 ENCOUNTER — Other Ambulatory Visit: Payer: Self-pay

## 2019-12-17 DIAGNOSIS — R072 Precordial pain: Secondary | ICD-10-CM

## 2019-12-17 LAB — LIPID PANEL
Chol/HDL Ratio: 3.1 ratio (ref 0.0–4.4)
Cholesterol, Total: 173 mg/dL (ref 100–199)
HDL: 55 mg/dL (ref >39)
LDL Chol Calc (NIH): 106 mg/dL — ABNORMAL HIGH (ref 0–99)
Triglycerides: 63 mg/dL (ref 0–149)
VLDL Cholesterol Cal: 12 mg/dL (ref 5–40)

## 2019-12-17 LAB — ALT: ALT: 29 IU/L (ref 0–32)

## 2019-12-19 ENCOUNTER — Telehealth: Payer: Self-pay

## 2019-12-19 DIAGNOSIS — R0789 Other chest pain: Secondary | ICD-10-CM

## 2019-12-19 DIAGNOSIS — E785 Hyperlipidemia, unspecified: Secondary | ICD-10-CM

## 2019-12-19 MED ORDER — ATORVASTATIN CALCIUM 20 MG PO TABS
20.0000 mg | ORAL_TABLET | Freq: Every day | ORAL | 3 refills | Status: DC
Start: 2019-12-19 — End: 2020-12-08

## 2019-12-19 NOTE — Telephone Encounter (Signed)
-----   Message from Sueanne Margarita, MD sent at 12/17/2019  5:45 PM EDT ----- LDL goal < 70 and her LDL was 106.  Please have her start Lipitor 20mg  daily and repeat FLP and ALT in 6 weeks

## 2019-12-31 DIAGNOSIS — Z1159 Encounter for screening for other viral diseases: Secondary | ICD-10-CM | POA: Diagnosis not present

## 2020-01-02 DIAGNOSIS — M1711 Unilateral primary osteoarthritis, right knee: Secondary | ICD-10-CM | POA: Diagnosis not present

## 2020-01-03 DIAGNOSIS — D122 Benign neoplasm of ascending colon: Secondary | ICD-10-CM | POA: Diagnosis not present

## 2020-01-03 DIAGNOSIS — K644 Residual hemorrhoidal skin tags: Secondary | ICD-10-CM | POA: Diagnosis not present

## 2020-01-03 DIAGNOSIS — Z800 Family history of malignant neoplasm of digestive organs: Secondary | ICD-10-CM | POA: Diagnosis not present

## 2020-01-03 DIAGNOSIS — Z1211 Encounter for screening for malignant neoplasm of colon: Secondary | ICD-10-CM | POA: Diagnosis not present

## 2020-01-07 DIAGNOSIS — D122 Benign neoplasm of ascending colon: Secondary | ICD-10-CM | POA: Diagnosis not present

## 2020-01-09 DIAGNOSIS — M1711 Unilateral primary osteoarthritis, right knee: Secondary | ICD-10-CM | POA: Diagnosis not present

## 2020-01-30 ENCOUNTER — Other Ambulatory Visit: Payer: Medicare Other

## 2020-02-04 ENCOUNTER — Other Ambulatory Visit: Payer: Medicare Other

## 2020-02-05 ENCOUNTER — Other Ambulatory Visit: Payer: Self-pay

## 2020-02-05 ENCOUNTER — Other Ambulatory Visit: Payer: Medicare Other | Admitting: *Deleted

## 2020-02-05 DIAGNOSIS — E785 Hyperlipidemia, unspecified: Secondary | ICD-10-CM

## 2020-02-05 LAB — LIPID PANEL
Chol/HDL Ratio: 2.2 ratio (ref 0.0–4.4)
Cholesterol, Total: 152 mg/dL (ref 100–199)
HDL: 70 mg/dL (ref >39)
LDL Chol Calc (NIH): 72 mg/dL (ref 0–99)
Triglycerides: 44 mg/dL (ref 0–149)
VLDL Cholesterol Cal: 10 mg/dL (ref 5–40)

## 2020-02-05 LAB — ALT: ALT: 32 IU/L (ref 0–32)

## 2020-02-12 DIAGNOSIS — L821 Other seborrheic keratosis: Secondary | ICD-10-CM | POA: Diagnosis not present

## 2020-02-12 DIAGNOSIS — D1801 Hemangioma of skin and subcutaneous tissue: Secondary | ICD-10-CM | POA: Diagnosis not present

## 2020-02-12 DIAGNOSIS — D1739 Benign lipomatous neoplasm of skin and subcutaneous tissue of other sites: Secondary | ICD-10-CM | POA: Diagnosis not present

## 2020-02-12 DIAGNOSIS — L818 Other specified disorders of pigmentation: Secondary | ICD-10-CM | POA: Diagnosis not present

## 2020-02-12 DIAGNOSIS — L570 Actinic keratosis: Secondary | ICD-10-CM | POA: Diagnosis not present

## 2020-02-12 DIAGNOSIS — L814 Other melanin hyperpigmentation: Secondary | ICD-10-CM | POA: Diagnosis not present

## 2020-03-18 ENCOUNTER — Other Ambulatory Visit: Payer: Self-pay | Admitting: Internal Medicine

## 2020-03-18 DIAGNOSIS — I2510 Atherosclerotic heart disease of native coronary artery without angina pectoris: Secondary | ICD-10-CM | POA: Diagnosis not present

## 2020-03-18 DIAGNOSIS — D126 Benign neoplasm of colon, unspecified: Secondary | ICD-10-CM | POA: Diagnosis not present

## 2020-03-18 DIAGNOSIS — Z1231 Encounter for screening mammogram for malignant neoplasm of breast: Secondary | ICD-10-CM

## 2020-03-18 DIAGNOSIS — Z800 Family history of malignant neoplasm of digestive organs: Secondary | ICD-10-CM | POA: Diagnosis not present

## 2020-03-18 DIAGNOSIS — I10 Essential (primary) hypertension: Secondary | ICD-10-CM | POA: Diagnosis not present

## 2020-03-25 ENCOUNTER — Ambulatory Visit
Admission: RE | Admit: 2020-03-25 | Discharge: 2020-03-25 | Disposition: A | Discharge status: Home / Self Care | Payer: Medicare Other | Source: Ambulatory Visit | Attending: Internal Medicine | Admitting: Internal Medicine

## 2020-03-25 ENCOUNTER — Other Ambulatory Visit: Payer: Self-pay

## 2020-03-25 DIAGNOSIS — Z1231 Encounter for screening mammogram for malignant neoplasm of breast: Secondary | ICD-10-CM

## 2020-07-03 DIAGNOSIS — M1711 Unilateral primary osteoarthritis, right knee: Secondary | ICD-10-CM | POA: Diagnosis not present

## 2020-07-09 ENCOUNTER — Telehealth: Payer: Self-pay | Admitting: *Deleted

## 2020-07-09 NOTE — Telephone Encounter (Signed)
Primary Cardiologist:Traci Turner, MD  Chart reviewed as part of pre-operative protocol coverage. Because of Tammy Holland's past medical history and time since last visit, he/she will require a follow-up visit in order to better assess preoperative cardiovascular risk.  Pre-op covering staff: - Please schedule appointment and call patient to inform them. - Please contact requesting surgeon's office via preferred method (i.e, phone, fax) to inform them of need for appointment prior to surgery.  If applicable, this message will also be routed to pharmacy pool and/or primary cardiologist for input on holding anticoagulant/antiplatelet agent as requested below so that this information is available at time of patient's appointment.   [name], NP  07/09/2020, 2:15 PM

## 2020-07-09 NOTE — Telephone Encounter (Signed)
   [facility] Pre-operative Risk Assessment    HEARTCARE STAFF: - Please ensure there is not already an duplicate clearance open for this procedure. - Under Visit Info/Reason for Call, type in Other and utilize the format Clearance MM/DD/YY or Clearance TBD. Do not use dashes or single digits. - If request is for dental extraction, please clarify the # of teeth to be extracted.  Request for surgical clearance:  1. What type of surgery is being performed? RIGHT TOTAL KNEE ARTHROPLASTY   2. When is this surgery scheduled? 09/14/20   3. What type of clearance is required (medical clearance vs. Pharmacy clearance to hold med vs. Both)? MEDICAL  4. Are there any medications that need to be held prior to surgery and how long? ASA    5. Practice name and name of physician performing surgery? [facility]; DR. FRANK ALUISIO   6. What is the office phone number? [phone]   7.   What is the office fax number? [phone] ATTN: [place]  8.   Anesthesia type (None, local, MAC, general) ? CHOICE   Julaine Hua 07/09/2020, 1:52 PM  _________________________________________________________________   (provider comments below)

## 2020-07-09 NOTE — Telephone Encounter (Signed)
LM2CB-needs appt for cardiac clearance 

## 2020-07-10 NOTE — Telephone Encounter (Signed)
I called the patient Tammy Holland and informed her of why I was calling and would like to get her scheduled for an appointment for clearance for her upcoming procedure. Patient is scheduled to see Dr. Radford Pax on 08/05/20 at 11:20 AM

## 2020-07-10 NOTE — Telephone Encounter (Signed)
Tammy Holland is returning Tammy Holland's call.

## 2020-07-10 NOTE — Telephone Encounter (Signed)
I called and left a voice message for Glendale Chard stating the patient Mrs. Tammy Holland needs an appointment for cardiac clearance and that she is scheduled to see Dr. Radford Pax on 08/06/19 at 11:20 AM. I stated that if she had any questions to give our office a call and ask for the Pre-Op callback pool.

## 2020-08-04 ENCOUNTER — Encounter: Payer: Self-pay | Admitting: Cardiology

## 2020-08-04 DIAGNOSIS — E785 Hyperlipidemia, unspecified: Secondary | ICD-10-CM | POA: Insufficient documentation

## 2020-08-04 DIAGNOSIS — I2510 Atherosclerotic heart disease of native coronary artery without angina pectoris: Secondary | ICD-10-CM | POA: Insufficient documentation

## 2020-08-04 NOTE — Progress Notes (Signed)
Cardiology Cardiology Note    Date:  08/05/2020   ID:  Tammy Holland, DOB 1940-12-24, MRN [id number]  PCP:  Leeroy Cha, MD  Cardiologist:  Fransico Him, MD   Chief Complaint  Patient presents with  . Coronary Artery Disease  . Hypertension  . Hyperlipidemia    History of Present Illness:  Tammy Holland is a 80 y.o. female with a hx of asthma, breast CA, and HTN.  She has a remote hx of tobacco use and has a family hx of CAD in her brother who had a CABG.  She was referred to Cardiology for evaluation of atypical chest pain that was felt in the past to be due to GERD.  Coronary CTA was done due to persistence of CP and showed 25-49% mLAD non obstructive disease with normal FFR.  She was started on statin for HLD with LDL goal < 70 and started on ASA.    She is here today for followup and is doing well.  She has chronic DOE when she exercises related to her asthma that is stable.  She denies any chest pain or pressure, PND, orthopnea, LE edema, palpitations or syncope. She has had some dizzy spells when she has been exercising and she lifts weights above her head but no syncope.  She is compliant with her meds and is tolerating meds with no SE.  She needs preop cardiac clearance for knee replacement surgery in Feb 2022.  Past Medical History:  Diagnosis Date  . Arthritis    oa  . Asthma   . Breast cancer ([place])   . CAD (coronary artery disease), native coronary artery    coronary CTA 2021 with 25-49% mLAD stenosis and normal FFR  . Cancer [facility]) 2009   BREAST left partial mastectomy and radiation  . Clinically significant macular edema with cotton wool spots    takes preservision for  . Cold within last month   spitting up clear mucous now, no fevers  . Dyspnea    with exertion only  . Hearing loss    both ears  . Hemorrhoids   . HLD (hyperlipidemia)    LDL goal < 70  . Hypertension   . Personal history of radiation therapy     Past Surgical History:   Procedure Laterality Date  . BREAST LUMPECTOMY    . colonscopy    . TONSILLECTOMY AND ADENOIDECTOMY  age 17  . TOTAL KNEE ARTHROPLASTY Left 07/04/2016   Procedure: LEFT TOTAL KNEE ARTHROPLASTY;  Surgeon: Gaynelle Arabian, MD;  Location: [facility];  Service: Orthopedics;  Laterality: Left;  . varicose vein saline injections      Current Medications: Current Meds  Medication Sig  . acetaminophen (TYLENOL) 500 MG tablet Take 1,000 mg by mouth at bedtime as needed (for pain).  Pamella Pert HFA [id number] MCG/ACT inhaler Inhale 2 puffs into the lungs 2 (two) times daily.  Marland Kitchen albuterol (PROVENTIL HFA;VENTOLIN HFA) 108 (90 Base) MCG/ACT inhaler Inhale 2 puffs into the lungs every 4 (four) hours as needed for wheezing or shortness of breath.  Marland Kitchen aspirin EC 81 MG tablet Take 1 tablet (81 mg total) by mouth daily.  Marland Kitchen atorvastatin (LIPITOR) 20 MG tablet Take 1 tablet (20 mg total) by mouth daily.  . Biotin 5000 MCG SUBL Place 1 tablet under the tongue daily.  . bisacodyl (DULCOLAX) 5 MG EC tablet Take 5 mg by mouth daily as needed for moderate constipation.  . calcium carbonate (TUMS - DOSED IN MG  ELEMENTAL CALCIUM) 500 MG chewable tablet Chew 1 tablet by mouth as needed for indigestion or heartburn.  . fexofenadine (ALLEGRA) 180 MG tablet Take 180 mg by mouth daily as needed for allergies.  . fluticasone (FLONASE) 50 MCG/ACT nasal spray Place 1-2 sprays into both nostrils daily as needed for allergies.  Marland Kitchen guaiFENesin (MUCINEX) 600 MG 12 hr tablet Take 600 mg by mouth every 6 (six) hours as needed for cough (for cold/cough symptoms).  Marland Kitchen guaiFENesin-codeine (ROBITUSSIN AC) 100-10 MG/5ML syrup Take 5 mLs by mouth 3 (three) times daily as needed for cough.  . hydrochlorothiazide (MICROZIDE) 12.5 MG capsule Take 12.5 mg by mouth daily.   Marland Kitchen ibuprofen (ADVIL) 200 MG tablet Take 200 mg by mouth every 6 (six) hours as needed for mild pain (knee).  Marland Kitchen ketotifen (ZADITOR) 0.025 % ophthalmic solution Place 1 drop into both  eyes 2 (two) times daily as needed (for allergy eyes).  . metoprolol tartrate (LOPRESSOR) 50 MG tablet Take 1 tablet (50 mg total) by mouth once for 1 dose. Take 1 tablet (50 mg) two hours prior to CT scan.  . Misc Natural Products (GLUCOSAMINE CHOND CMP TRIPLE PO) Take 2 tablets by mouth daily.  . Multiple Vitamin (MULTIVITAMIN) capsule Take 1 capsule by mouth daily.  . Multiple Vitamins-Minerals (PRESERVISION AREDS 2 PO) Take 1 tablet by mouth in the morning and at bedtime.  . naproxen sodium (ALEVE) 220 MG tablet Take 220 mg by mouth daily as needed (knee).  . Omega-3 Fatty Acids (FISH OIL) 1200 MG CAPS Take 1 capsule by mouth daily.  Marland Kitchen oxymetazoline (AFRIN) 0.05 % nasal spray Place 1 spray into both nostrils 2 (two) times daily as needed for congestion.  . polyethylene glycol (MIRALAX / GLYCOLAX) 17 g packet Take 17 g by mouth daily as needed.  . psyllium (METAMUCIL) 58.6 % packet Take 1 packet by mouth as needed.  . sodium chloride (OCEAN) 0.65 % SOLN nasal spray Place 1-2 sprays into both nostrils 3 (three) times daily as needed for congestion.    Allergies:   Patient has no known allergies.   Social History   Socioeconomic History  . Marital status: Married    Spouse name: Not on file  . Number of children: Not on file  . Years of education: Not on file  . Highest education level: Not on file  Occupational History  . Not on file  Tobacco Use  . Smoking status: Former Smoker    Years: 30.00    Types: Cigarettes    Quit date: 07/25/1976    Years since quitting: 44.0  . Smokeless tobacco: Never Used  . Tobacco comment: social smoking only  Substance and Sexual Activity  . Alcohol use: Yes    Alcohol/week: 2.0 standard drinks    Types: 2 Glasses of wine per week    Comment: per day.  . Drug use: No  . Sexual activity: Not on file  Other Topics Concern  . Not on file  Social History Narrative  . Not on file   Social Determinants of Health   Financial Resource Strain:  Not on file  Food Insecurity: Not on file  Transportation Needs: Not on file  Physical Activity: Not on file  Stress: Not on file  Social Connections: Not on file     Family History:  The patient's family history includes Alcohol abuse in her brother; Breast cancer in her cousin; Cancer in her father; Colon cancer in her sister; Coronary artery disease in her  brother; Depression in her brother; Emphysema in her mother; Hepatitis in her father; Hypertension in her sister; Stroke in her maternal grandmother.   ROS:   Please see the history of present illness.    ROS All other systems reviewed and are negative.  No flowsheet data found.     PHYSICAL EXAM:   VS:  BP 122/72   Pulse 68   Ht 5\' 4"  (1.626 m)   Wt 130 lb 9.6 oz (59.2 kg)   SpO2 98%   BMI 22.42 kg/m     GEN: Well nourished, well developed in no acute distress HEENT: Normal NECK: No JVD; No carotid bruits LYMPHATICS: No lymphadenopathy CARDIAC:RRR, no murmurs, rubs, gallops RESPIRATORY:  Clear to auscultation without rales, wheezing or rhonchi  ABDOMEN: Soft, non-tender, non-distended MUSCULOSKELETAL:  No edema; No deformity  SKIN: Warm and dry NEUROLOGIC:  Alert and oriented x 3 PSYCHIATRIC:  Normal affect    Wt Readings from Last 3 Encounters:  08/05/20 130 lb 9.6 oz (59.2 kg)  11/18/19 130 lb 3.2 oz (59.1 kg)  07/04/16 123 lb (55.8 kg)      Studies/Labs Reviewed:   EKG:  EKG is ordered today.  The ekg ordered today demonstrates NSR with nonspecific ST abnormality  Coronary CTA 11/2019  Aorta: Normal size. Scattered calcifications in the ascending and descending thoracic aorta. No dissection.  Aortic Valve:  Trileaflet.  No calcifications.  Coronary Arteries:  Normal coronary origin.  Right dominance.  RCA is a large dominant artery that gives rise to PDA and PLVB. There is no plaque.  Left main is a large artery that gives rise to LAD and LCX arteries. There is no plaque.  LAD is a  large vessel that gives rise to 2 large branching D1 and D2 arteries. There is mild calcified plaque in the mid LAD with associated stenosis of 25-49%.  LCX is a non-dominant artery that gives rise to one small OM1 branch. There is no plaque in the proximal and mid LCx. The distal LCx is poorly visualized.  Other findings:  Normal pulmonary vein drainage into the left atrium.  Normal let atrial appendage without a thrombus.  Normal size of the pulmonary artery.  Small PFO is present.  IMPRESSION: 1. Coronary calcium score of 18. This was 32nd percentile for age and sex matched control.  2.  Normal coronary origin with right dominance.  3.  Mild atherosclerosis of the mid LAD.  CAD-RADS 2.  4.  Consider non-atherosclerotic causes of chest pain.  5.  Recommend preventive therapy and risk factor modification.  6.  This study has been submitted for FFR analysis.  Erleen Egner   Recent Labs: 12/05/2019: BUN 11; Creatinine, Ser 0.75; Potassium 4.8; Sodium 143 02/05/2020: ALT 32   Lipid Panel    Component Value Date/Time   CHOL 152 02/05/2020 0834   TRIG 44 02/05/2020 0834   HDL 70 02/05/2020 0834   CHOLHDL 2.2 02/05/2020 0834   LDLCALC 72 02/05/2020 0834    Additional studies/ records that were reviewed today include:  Office noted from PCP    ASSESSMENT:    1. Coronary artery disease due to lipid rich plaque   2. Benign essential HTN   3. Pure hypercholesterolemia   4. Preoperative clearance      PLAN:  In order of problems listed above:  1.  Chest pain/ASCAD -coronary CTA showed non obstructive CAD with 25-49% mLAD stenosis and normal FFR -she has not had any further atypical CP -continue statin  and ASA  2.  HTN -BP is well controlled on exam today -continue HCTZ 12.5mg  daily -SCR was stable at 0.75 and K+ 4.8 in May 2021  3.  HLD -LDL goal < 70 -LDL was 72 in July 2021 -continue Atorvastatin 20mg  daily  4.  Preoperative  surgical clearance -She is having TKR next month -she walks 4 miles daily without any problems -her functional capacity is good and she is able to complete 7.01 mets, therefore no further nonischemic evaluation is indicated -she is low risk by the revised cardiac risk index (RCRI) with a 0.9% perioperative risk of major cardiac event.   Medication Adjustments/Labs and Tests Ordered: Current medicines are reviewed at length with the patient today.  Concerns regarding medicines are outlined above.  Medication changes, Labs and Tests ordered today are listed in the Patient Instructions below.  There are no Patient Instructions on file for this visit.   Signed, Fransico Him, MD  08/05/2020 11:42 AM    [facility] [address], [place], [place]  [address] Phone: [phone]; Fax: [phone]

## 2020-08-05 ENCOUNTER — Other Ambulatory Visit: Payer: Self-pay

## 2020-08-05 ENCOUNTER — Encounter: Payer: Self-pay | Admitting: Cardiology

## 2020-08-05 ENCOUNTER — Ambulatory Visit (INDEPENDENT_AMBULATORY_CARE_PROVIDER_SITE_OTHER): Payer: Medicare Other | Admitting: Cardiology

## 2020-08-05 VITALS — BP 122/72 | HR 68 | Ht 64.0 in | Wt 130.6 lb

## 2020-08-05 DIAGNOSIS — I2583 Coronary atherosclerosis due to lipid rich plaque: Secondary | ICD-10-CM | POA: Diagnosis not present

## 2020-08-05 DIAGNOSIS — E7800 Pure hypercholesterolemia, unspecified: Secondary | ICD-10-CM

## 2020-08-05 DIAGNOSIS — I10 Essential (primary) hypertension: Secondary | ICD-10-CM | POA: Diagnosis not present

## 2020-08-05 DIAGNOSIS — I2510 Atherosclerotic heart disease of native coronary artery without angina pectoris: Secondary | ICD-10-CM | POA: Diagnosis not present

## 2020-08-05 DIAGNOSIS — Z01818 Encounter for other preprocedural examination: Secondary | ICD-10-CM

## 2020-08-05 NOTE — Patient Instructions (Signed)

## 2020-08-06 DIAGNOSIS — J45909 Unspecified asthma, uncomplicated: Secondary | ICD-10-CM | POA: Diagnosis not present

## 2020-08-06 DIAGNOSIS — I2510 Atherosclerotic heart disease of native coronary artery without angina pectoris: Secondary | ICD-10-CM | POA: Diagnosis not present

## 2020-08-06 DIAGNOSIS — M1711 Unilateral primary osteoarthritis, right knee: Secondary | ICD-10-CM | POA: Diagnosis not present

## 2020-08-06 DIAGNOSIS — R0600 Dyspnea, unspecified: Secondary | ICD-10-CM | POA: Diagnosis not present

## 2020-08-06 DIAGNOSIS — Z01810 Encounter for preprocedural cardiovascular examination: Secondary | ICD-10-CM | POA: Diagnosis not present

## 2020-08-06 DIAGNOSIS — I10 Essential (primary) hypertension: Secondary | ICD-10-CM | POA: Diagnosis not present

## 2020-08-06 DIAGNOSIS — R8290 Unspecified abnormal findings in urine: Secondary | ICD-10-CM | POA: Diagnosis not present

## 2020-08-24 DIAGNOSIS — H903 Sensorineural hearing loss, bilateral: Secondary | ICD-10-CM | POA: Diagnosis not present

## 2020-08-24 DIAGNOSIS — Z974 Presence of external hearing-aid: Secondary | ICD-10-CM | POA: Diagnosis not present

## 2020-08-31 NOTE — H&P (Signed)
TOTAL KNEE ADMISSION H&P  Patient is being admitted for right total knee arthroplasty.  Subjective:  Chief Complaint: Right knee pain.  HPI: Tammy Holland, 80 y.o. female has a history of pain and functional disability in the right knee due to arthritis and has failed non-surgical conservative treatments for greater than 12 weeks to include corticosteriod injections, viscosupplementation injections and activity modification. Onset of symptoms was gradual, starting several years ago with gradually worsening course since that time. The patient noted no past surgery on the right knee.  Patient currently rates pain in the right knee at 6 out of 10 with activity. Patient has worsening of pain with activity and weight bearing, pain that interferes with activities of daily living and crepitus. Patient has evidence of bone on bone arthritis in the medial compartment with significant patellofemoral narrowing also by imaging studies. There is no active infection.  Patient Active Problem List   Diagnosis Date Noted  . CAD (coronary artery disease), native coronary artery   . HLD (hyperlipidemia)   . OA (osteoarthritis) of knee 07/04/2016  . Breast cancer ([place]) 06/28/2011    Past Medical History:  Diagnosis Date  . Arthritis    oa  . Asthma   . Breast cancer ([place])   . CAD (coronary artery disease), native coronary artery    coronary CTA 2021 with 25-49% mLAD stenosis and normal FFR  . Cancer [facility] 2009   BREAST left partial mastectomy and radiation  . Clinically significant macular edema with cotton wool spots    takes preservision for  . Cold within last month   spitting up clear mucous now, no fevers  . Dyspnea    with exertion only  . Hearing loss    both ears  . Hemorrhoids   . HLD (hyperlipidemia)    LDL goal < 70  . Hypertension   . Personal history of radiation therapy     Past Surgical History:  Procedure Laterality Date  . BREAST LUMPECTOMY    . colonscopy    .  TONSILLECTOMY AND ADENOIDECTOMY  age 62  . TOTAL KNEE ARTHROPLASTY Left 07/04/2016   Procedure: LEFT TOTAL KNEE ARTHROPLASTY;  Surgeon: Gaynelle Arabian, MD;  Location: [facility];  Service: Orthopedics;  Laterality: Left;  . varicose vein saline injections      Prior to Admission medications   Medication Sig Start Date End Date Taking? Authorizing Provider  acetaminophen (TYLENOL) 500 MG tablet Take 1,000 mg by mouth at bedtime as needed (for pain).   Yes [provider]  albuterol (PROVENTIL HFA;VENTOLIN HFA) 108 (90 Base) MCG/ACT inhaler Inhale 2 puffs into the lungs every 4 (four) hours as needed for wheezing or shortness of breath.   Yes [provider]  Artificial Tear Solution (SOOTHE XP) SOLN Place 1 drop into both eyes daily as needed (dry eyes).   Yes [provider]  aspirin EC 81 MG tablet Take 1 tablet (81 mg total) by mouth daily. 12/10/19  Yes Turner, Eber Hong, MD  atorvastatin (LIPITOR) 20 MG tablet Take 1 tablet (20 mg total) by mouth daily. 12/19/19  Yes Turner, Eber Hong, MD  Biotin 5000 MCG SUBL Place 1 tablet under the tongue daily.   Yes [provider]  bisacodyl (DULCOLAX) 5 MG EC tablet Take 5 mg by mouth daily as needed for moderate constipation.   Yes [provider]  budesonide-formoterol (SYMBICORT) 80-4.5 MCG/ACT inhaler Inhale 2 puffs into the lungs 2 (two) times daily.   Yes [provider]  calcium carbonate (TUMS - DOSED IN MG ELEMENTAL CALCIUM) 500 MG chewable tablet Chew 1 tablet by mouth as needed for indigestion or heartburn.   Yes [provider]  fexofenadine (ALLEGRA) 180 MG tablet Take 180 mg by mouth daily.   Yes [provider]  fluticasone (FLONASE) 50 MCG/ACT nasal spray Place 1-2 sprays into both nostrils daily as needed for allergies.   Yes [provider]  hydrochlorothiazide (MICROZIDE) 12.5 MG capsule Take 12.5 mg by mouth daily.  06/25/11  Yes [provider]  ibuprofen  (ADVIL) 200 MG tablet Take 400 mg by mouth every 6 (six) hours as needed for mild pain (knee).   Yes [provider]  ketotifen (ZADITOR) 0.025 % ophthalmic solution Place 1 drop into both eyes 2 (two) times daily as needed (for allergy eyes).   Yes [provider]  Metamucil Fiber CHEW Chew 2 each by mouth daily.   Yes [provider]  Misc Natural Products (GLUCOSAMINE CHOND CMP TRIPLE PO) Take 2 tablets by mouth daily.   Yes [provider]  Multiple Vitamin (MULTIVITAMIN) capsule Take 1 capsule by mouth daily.   Yes [provider]  Multiple Vitamins-Minerals (PRESERVISION AREDS 2 PO) Take 1 tablet by mouth in the morning and at bedtime.   Yes [provider]  naproxen sodium (ALEVE) 220 MG tablet Take 220 mg by mouth daily as needed (knee).   Yes [provider]  Omega-3 Fatty Acids (FISH OIL) 1200 MG CAPS Take 1 capsule by mouth daily.   Yes [provider]  oxymetazoline (AFRIN) 0.05 % nasal spray Place 1 spray into both nostrils 2 (two) times daily as needed for congestion.   Yes [provider]  polyethylene glycol (MIRALAX / GLYCOLAX) 17 g packet Take 17 g by mouth daily as needed for moderate constipation.   Yes [provider]  predniSONE (DELTASONE) 20 MG tablet Take 20 mg by mouth See admin instructions. Tapered dose 08/24/20  Yes [provider]  sodium chloride (OCEAN) 0.65 % SOLN nasal spray Place 1-2 sprays into both nostrils 3 (three) times daily as needed for congestion.   Yes [provider]    No Known Allergies  Social History   Socioeconomic History  . Marital status: Married    Spouse name: Not on file  . Number of children: Not on file  . Years of education: Not on file  . Highest education level: Not on file  Occupational History  . Not on file  Tobacco Use  . Smoking status: Former Smoker    Years: 30.00    Types: Cigarettes    Quit date: 07/25/1976     Years since quitting: 44.1  . Smokeless tobacco: Never Used  . Tobacco comment: social smoking only  Substance and Sexual Activity  . Alcohol use: Yes    Alcohol/week: 2.0 standard drinks    Types: 2 Glasses of wine per week    Comment: per day.  . Drug use: No  . Sexual activity: Not on file  Other Topics Concern  . Not on file  Social History Narrative  . Not on file   Social Determinants of Health   Financial Resource Strain: Not on file  Food Insecurity: Not on file  Transportation Needs: Not on file  Physical Activity: Not on file  Stress: Not on file  Social Connections: Not on file  Intimate Partner Violence: Not on file    Tobacco Use: Medium Risk  .  Smoking Tobacco Use: Former Smoker  . Smokeless Tobacco Use: Never Used   Social History   Substance and Sexual Activity  Alcohol Use Yes  . Alcohol/week: 2.0 standard drinks  . Types: 2 Glasses of wine per week   Comment: per day.    Family History  Problem Relation Age of Onset  . Emphysema Mother   . Cancer Father        bladder  . Hepatitis Father   . Hypertension Sister   . Colon cancer Sister   . Coronary artery disease Brother        prostate, skin  . Alcohol abuse Brother   . Depression Brother   . Stroke Maternal Grandmother   . Breast cancer Cousin     Review of Systems  Constitutional: Negative for chills and fever.  HENT: Negative for congestion, sore throat and tinnitus.   Eyes: Negative for double vision, photophobia and pain.  Respiratory: Negative for cough, shortness of breath and wheezing.   Cardiovascular: Negative for chest pain, palpitations and orthopnea.  Gastrointestinal: Negative for heartburn, nausea and vomiting.  Genitourinary: Negative for dysuria, frequency and urgency.  Musculoskeletal: Positive for joint pain.  Neurological: Negative for dizziness, weakness and headaches.    Objective:  Physical Exam: Well nourished and well developed.  General: Alert and  oriented x3, cooperative and pleasant, no acute distress.  Head: normocephalic, atraumatic, neck supple.  Eyes: EOMI.  Respiratory: breath sounds clear in all fields, no wheezing, rales, or rhonchi. Cardiovascular: Regular rate and rhythm, no murmurs, gallops or rubs.  Abdomen: non-tender to palpation and soft, normoactive bowel sounds. Musculoskeletal:  Right Knee Exam:  No effusion present. No swelling present.  The range of motion is: 0 to 130 degrees.  Moderate crepitus on range of motion of the knee.  Significant medial joint line tenderness.  No lateral joint line tenderness.  The knee is stable.   Calves soft and nontender. Motor function intact in LE. Strength 5/5 LE bilaterally. Neuro: Distal pulses 2+. Sensation to light touch intact in LE.  Imaging Review Plain radiographs demonstrate severe degenerative joint disease of the right knee. The overall alignment is neutral. The bone quality appears to be adequate for age and reported activity level.  Assessment/Plan:  End stage arthritis, right knee   The patient history, physical examination, clinical judgment of the provider and imaging studies are consistent with end stage degenerative joint disease of the right knee and total knee arthroplasty is deemed medically necessary. The treatment options including medical management, injection therapy arthroscopy and arthroplasty were discussed at length. The risks and benefits of total knee arthroplasty were presented and reviewed. The risks due to aseptic loosening, infection, stiffness, patella tracking problems, thromboembolic complications and other imponderables were discussed. The patient acknowledged the explanation, agreed to proceed with the plan and consent was signed. Patient is being admitted for inpatient treatment for surgery, pain control, PT, OT, prophylactic antibiotics, VTE prophylaxis, progressive ambulation and ADLs and discharge planning. The patient is planning to  be discharged home.   Patient's anticipated LOS is less than 2 midnights, meeting these requirements: - Younger than 70 - Lives within 1 hour of care - Has a competent adult at home to recover with post-op recover - NO history of  - Chronic pain requiring opiods  - Diabetes  - Coronary Artery Disease  - Heart failure  - Heart attack  - Stroke  - DVT/VTE  - Cardiac arrhythmia  - Respiratory Failure/COPD  - Renal failure  -  Anemia  - Advanced Liver disease  Therapy Plans: Outpatient therapy at [facility] Disposition: Home with husband Planned DVT Prophylaxis: Xarelto 10 mg QD DME Needed: Gilford Rile PCP: Leeroy Cha, MD (clearance received) Cardiologist: Fransico Him, MD (clearance received) TXA: IV Allergies: NKDA Anesthesia Concerns: None BMI: 22.2 Last HgbA1c: Not diabetic Pharmacy: [facility] ([address])  Other: Hx breast CA  - Patient was instructed on what medications to stop prior to surgery. - Follow-up visit in 2 weeks with Dr. Wynelle Link - Begin physical therapy following surgery - Pre-operative lab work as pre-surgical testing - Prescriptions will be provided in hospital at time of discharge  Theresa Duty, PA-C Orthopedic Surgery [facility] [place]

## 2020-09-02 NOTE — Patient Instructions (Signed)
DUE TO COVID-19 ONLY ONE VISITOR IS ALLOWED TO COME WITH YOU AND STAY IN THE WAITING ROOM ONLY DURING PRE OP AND PROCEDURE DAY OF SURGERY. THE 1 VISITOR  MAY VISIT WITH YOU AFTER SURGERY IN YOUR PRIVATE ROOM DURING VISITING HOURS ONLY!  YOU NEED TO HAVE A COVID 19 TEST ON: 09/10/20 @ 11:30 AM , THIS TEST MUST BE DONE BEFORE SURGERY,  COVID TESTING SITE [place] [address], IT IS ON THE RIGHT GOING OUT [address] APPROXIMATELY  2 MINUTES PAST [employer] ON THE RIGHT. ONCE YOUR COVID TEST IS COMPLETED,  PLEASE BEGIN THE QUARANTINE INSTRUCTIONS AS OUTLINED IN YOUR HANDOUT.                Tammy Holland    Your procedure is scheduled on: 09/14/20   Report to [facility] Main  Entrance   Report to short stay at: 5:45 AM     Call this number if you have problems the morning of surgery [phone]    Remember:  NO SOLID FOOD AFTER MIDNIGHT THE NIGHT PRIOR TO SURGERY. NOTHING BY MOUTH EXCEPT CLEAR LIQUIDS UNTIL: 5:20 AM . PLEASE FINISH ENSURE DRINK PER SURGEON ORDER  WHICH NEEDS TO BE COMPLETED AT: 5:20 AM .  CLEAR LIQUID DIET  Foods Allowed                                                                     Foods Excluded  Coffee and tea, regular and decaf                             liquids that you cannot  Plain Jell-O any favor except red or purple                                           see through such as: Fruit ices (not with fruit pulp)                                     milk, soups, orange juice  Iced Popsicles                                    All solid food Carbonated beverages, regular and diet                                    Cranberry, grape and apple juices Sports drinks like Gatorade Lightly seasoned clear broth or consume(fat free) Sugar, honey syrup  Sample Menu Breakfast                                Lunch  Supper Cranberry juice                    Beef broth                             Chicken broth Jell-O                                     Grape juice                           Apple juice Coffee or tea                        Jell-O                                      Popsicle                                                Coffee or tea                        Coffee or tea  _____________________________________________________________________  BRUSH YOUR TEETH MORNING OF SURGERY AND RINSE YOUR MOUTH OUT, NO CHEWING GUM CANDY OR MINTS.    Take these medicines the morning of surgery with A SIP OF WATER: allegra.                               You may not have any metal on your body including hair pins and              piercings  Do not wear jewelry, make-up, lotions, powders or perfumes, deodorant             Do not wear nail polish on your fingernails.  Do not shave  48 hours prior to surgery.    Do not bring valuables to the hospital. [name].  Contacts, dentures or bridgework may not be worn into surgery.  Leave suitcase in the car. After surgery it may be brought to your room.     Patients discharged the day of surgery will not be allowed to drive home. IF YOU ARE HAVING SURGERY AND GOING HOME THE SAME DAY, YOU MUST HAVE AN ADULT TO DRIVE YOU HOME AND BE WITH YOU FOR 24 HOURS. YOU MAY GO HOME BY TAXI OR UBER OR ORTHERWISE, BUT AN ADULT MUST ACCOMPANY YOU HOME AND STAY WITH YOU FOR 24 HOURS.  Name and phone number of your driver:  Special Instructions: N/A              Please read over the following fact sheets you were given: _____________________________________________________________________         [facility] - Preparing for Surgery Before surgery, you can play an important role.  Because skin is not sterile, your skin needs to be as free of germs as possible.  You can reduce  the number of germs on your skin by washing with CHG (chlorahexidine gluconate) soap before surgery.  CHG is an antiseptic cleaner  which kills germs and bonds with the skin to continue killing germs even after washing. Please DO NOT use if you have an allergy to CHG or antibacterial soaps.  If your skin becomes reddened/irritated stop using the CHG and inform your nurse when you arrive at Short Stay. Do not shave (including legs and underarms) for at least 48 hours prior to the first CHG shower.  You may shave your face/neck. Please follow these instructions carefully:  1.  Shower with CHG Soap the night before surgery and the  morning of Surgery.  2.  If you choose to wash your hair, wash your hair first as usual with your  normal  shampoo.  3.  After you shampoo, rinse your hair and body thoroughly to remove the  shampoo.                           4.  Use CHG as you would any other liquid soap.  You can apply chg directly  to the skin and wash                       Gently with a scrungie or clean washcloth.  5.  Apply the CHG Soap to your body ONLY FROM THE NECK DOWN.   Do not use on face/ open                           Wound or open sores. Avoid contact with eyes, ears mouth and genitals (private parts).                       Wash face,  Genitals (private parts) with your normal soap.             6.  Wash thoroughly, paying special attention to the area where your surgery  will be performed.  7.  Thoroughly rinse your body with warm water from the neck down.  8.  DO NOT shower/wash with your normal soap after using and rinsing off  the CHG Soap.                9.  Pat yourself dry with a clean towel.            10.  Wear clean pajamas.            11.  Place clean sheets on your bed the night of your first shower and do not  sleep with pets. Day of Surgery : Do not apply any lotions/deodorants the morning of surgery.  Please wear clean clothes to the hospital/surgery center.  FAILURE TO FOLLOW THESE INSTRUCTIONS MAY RESULT IN THE CANCELLATION OF YOUR SURGERY PATIENT SIGNATURE_________________________________  NURSE  SIGNATURE__________________________________  ________________________________________________________________________   Tammy Holland  An incentive spirometer is a tool that can help keep your lungs clear and active. This tool measures how well you are filling your lungs with each breath. Taking long deep breaths may help reverse or decrease the chance of developing breathing (pulmonary) problems (especially infection) following:  A long period of time when you are unable to move or be active. BEFORE THE PROCEDURE   If the spirometer includes an indicator to show your best effort, your nurse or respiratory therapist will set it to  a desired goal.  If possible, sit up straight or lean slightly forward. Try not to slouch.  Hold the incentive spirometer in an upright position. INSTRUCTIONS FOR USE  1. Sit on the edge of your bed if possible, or sit up as far as you can in bed or on a chair. 2. Hold the incentive spirometer in an upright position. 3. Breathe out normally. 4. Place the mouthpiece in your mouth and seal your lips tightly around it. 5. Breathe in slowly and as deeply as possible, raising the piston or the ball toward the top of the column. 6. Hold your breath for 3-5 seconds or for as long as possible. Allow the piston or ball to fall to the bottom of the column. 7. Remove the mouthpiece from your mouth and breathe out normally. 8. Rest for a few seconds and repeat Steps 1 through 7 at least 10 times every 1-2 hours when you are awake. Take your time and take a few normal breaths between deep breaths. 9. The spirometer may include an indicator to show your best effort. Use the indicator as a goal to work toward during each repetition. 10. After each set of 10 deep breaths, practice coughing to be sure your lungs are clear. If you have an incision (the cut made at the time of surgery), support your incision when coughing by placing a pillow or rolled up towels firmly  against it. Once you are able to get out of bed, walk around indoors and cough well. You may stop using the incentive spirometer when instructed by your caregiver.  RISKS AND COMPLICATIONS  Take your time so you do not get dizzy or light-headed.  If you are in pain, you may need to take or ask for pain medication before doing incentive spirometry. It is harder to take a deep breath if you are having pain. AFTER USE  Rest and breathe slowly and easily.  It can be helpful to keep track of a log of your progress. Your caregiver can provide you with a simple table to help with this. If you are using the spirometer at home, follow these instructions: [place] IF:   You are having difficultly using the spirometer.  You have trouble using the spirometer as often as instructed.  Your pain medication is not giving enough relief while using the spirometer.  You develop fever of 100.5 F (38.1 C) or higher. SEEK IMMEDIATE MEDICAL CARE IF:   You cough up bloody sputum that had not been present before.  You develop fever of 102 F (38.9 C) or greater.  You develop worsening pain at or near the incision site. MAKE SURE YOU:   Understand these instructions.  Will watch your condition.  Will get help right away if you are not doing well or get worse. Document Released: 11/21/2006 Document Revised: 10/03/2011 Document Reviewed: 01/22/2007 [facility] Patient Information 2014 [place], [place].   ________________________________________________________________________

## 2020-09-03 ENCOUNTER — Encounter (HOSPITAL_COMMUNITY)
Admission: RE | Admit: 2020-09-03 | Discharge: 2020-09-03 | Disposition: A | Discharge status: Home / Self Care | Payer: Medicare Other | Source: Ambulatory Visit | Attending: Orthopedic Surgery | Admitting: Orthopedic Surgery

## 2020-09-03 ENCOUNTER — Other Ambulatory Visit: Payer: Self-pay

## 2020-09-03 ENCOUNTER — Encounter (HOSPITAL_COMMUNITY): Payer: Self-pay

## 2020-09-03 DIAGNOSIS — Z01812 Encounter for preprocedural laboratory examination: Secondary | ICD-10-CM | POA: Insufficient documentation

## 2020-09-03 LAB — SURGICAL PCR SCREEN
MRSA, PCR: NEGATIVE
Staphylococcus aureus: NEGATIVE

## 2020-09-03 LAB — TYPE AND SCREEN
ABO/RH(D): A POS
Antibody Screen: NEGATIVE

## 2020-09-03 LAB — APTT: aPTT: 29 seconds (ref 24–36)

## 2020-09-03 LAB — PROTIME-INR
INR: 1 (ref 0.8–1.2)
Prothrombin Time: 13 seconds (ref 11.4–15.2)

## 2020-09-03 LAB — CBC
HCT: 41.2 % (ref 36.0–46.0)
Hemoglobin: 13.6 g/dL (ref 12.0–15.0)
MCH: 31.8 pg (ref 26.0–34.0)
MCHC: 33 g/dL (ref 30.0–36.0)
MCV: 96.3 fL (ref 80.0–100.0)
Platelets: 268 10*3/uL (ref 150–400)
RBC: 4.28 MIL/uL (ref 3.87–5.11)
RDW: 13.8 % (ref 11.5–15.5)
WBC: 10 10*3/uL (ref 4.0–10.5)
nRBC: 0 % (ref 0.0–0.2)

## 2020-09-03 LAB — COMPREHENSIVE METABOLIC PANEL
ALT: 46 U/L — ABNORMAL HIGH (ref 0–44)
AST: 38 U/L (ref 15–41)
Albumin: 4.3 g/dL (ref 3.5–5.0)
Alkaline Phosphatase: 43 U/L (ref 38–126)
Anion gap: 10 (ref 5–15)
BUN: 19 mg/dL (ref 8–23)
CO2: 26 mmol/L (ref 22–32)
Calcium: 9.6 mg/dL (ref 8.9–10.3)
Chloride: 102 mmol/L (ref 98–111)
Creatinine, Ser: 0.78 mg/dL (ref 0.44–1.00)
GFR, Estimated: >60 mL/min (ref >60)
Glucose, Bld: 107 mg/dL — ABNORMAL HIGH (ref 70–99)
Potassium: 5 mmol/L (ref 3.5–5.1)
Sodium: 138 mmol/L (ref 135–145)
Total Bilirubin: 0.8 mg/dL (ref 0.3–1.2)
Total Protein: 6.8 g/dL (ref 6.5–8.1)

## 2020-09-03 NOTE — Progress Notes (Signed)
COVID Vaccine Completed: Yes Date COVID Vaccine completed:05/2020 [place] COVID vaccine manufacturer:   Moderna    PCP - Dr. Alen Blew.: Clearance: 08/13/20 Cardiologist - Dr. Fransico Him. LOV: 08/05/20  Chest x-ray -  EKG -08/06/20  Stress Test -  ECHO -  Cardiac Cath -  Pacemaker/ICD device last checked:  Sleep Study -  CPAP -   Fasting Blood Sugar -  Checks Blood Sugar _____ times a day  Blood Thinner Instructions: Aspirin Instructions: To stop 7 days  Last Dose:  Anesthesia review: Hx: HTN,CAD  Patient denies shortness of breath, fever, cough and chest pain at PAT appointment   Patient verbalized understanding of instructions that were given to them at the PAT appointment. Patient was also instructed that they will need to review over the PAT instructions again at home before surgery.

## 2020-09-04 NOTE — Anesthesia Preprocedure Evaluation (Addendum)
Anesthesia Evaluation  Patient identified by MRN, date of birth, ID band Patient awake    Reviewed: Allergy & Precautions, NPO status , Patient's Chart, lab work & pertinent test results  Airway Mallampati: II  TM Distance: >3 FB Neck ROM: Full    Dental  (+) Teeth Intact   Pulmonary shortness of breath and with exertion, asthma , former smoker,    Pulmonary exam normal        Cardiovascular hypertension, Pt. on medications + CAD   Rhythm:Regular Rate:Normal     Neuro/Psych negative neurological ROS  negative psych ROS   GI/Hepatic negative GI ROS, Neg liver ROS,   Endo/Other  negative endocrine ROS  Renal/GU negative Renal ROS  negative genitourinary   Musculoskeletal  (+) Arthritis , Osteoarthritis,  Left breast cancer s/p mastectomy   Abdominal (+)  Abdomen: soft. Bowel sounds: normal.  Peds  Hematology negative hematology ROS (+)   Anesthesia Other Findings   Reproductive/Obstetrics                           Anesthesia Physical Anesthesia Plan  ASA: III  Anesthesia Plan: Regional, Spinal and MAC   Post-op Pain Management:    Induction:   PONV Risk Score and Plan: 2 and Ondansetron, Dexamethasone, Propofol infusion and Treatment may vary due to age or medical condition  Airway Management Planned: Simple Face Mask, Natural Airway and Nasal Cannula  Additional Equipment: None  Intra-op Plan:   Post-operative Plan:   Informed Consent: I have reviewed the patients History and Physical, chart, labs and discussed the procedure including the risks, benefits and alternatives for the proposed anesthesia with the patient or authorized representative who has indicated his/her understanding and acceptance.     Dental advisory given  Plan Discussed with: CRNA  Anesthesia Plan Comments: (See PAT note 09/03/2020, Konrad Felix, PA-C Pt last seen by cardiology for preoperative  evaluation.  Per OV note, "She is having TKR next month -she walks 4 miles daily without any problems -her functional capacity is good and she is able to complete 7.01 mets, therefore no further nonischemic evaluation is indicated -she is low risk by the revised cardiac risk index (RCRI) with a 0.9% perioperative risk of major cardiac event."Pt last seen by cardiology for preoperative evaluation.  Per OV note, "She is having TKR next month -she walks 4 miles daily without any problems -her functional capacity is good and she is able to complete 7.01 mets, therefore no further nonischemic evaluation is indicated -she is low risk by the revised cardiac risk index (RCRI) with a 0.9% perioperative risk of major cardiac event." Lab Results      Component                Value               Date                      WBC                      10.0                09/03/2020                HGB                      13.6  09/03/2020                HCT                      41.2                09/03/2020                MCV                      96.3                09/03/2020                PLT                      268                 09/03/2020           Lab Results      Component                Value               Date                      NA                       138                 09/03/2020                K                        5.0                 09/03/2020                CO2                      26                  09/03/2020                GLUCOSE                  107 (H)             09/03/2020                BUN                      19                  09/03/2020                CREATININE               0.78                09/03/2020                CALCIUM                  9.6                 09/03/2020  GFRNONAA                 >60                 09/03/2020                GFRAA                    89                  12/05/2019          )      Anesthesia Quick  Evaluation

## 2020-09-04 NOTE — Progress Notes (Signed)
Anesthesia Chart Review   Case: [id number] Date/Time: 09/14/20 0805   Procedure: TOTAL KNEE ARTHROPLASTY (Right Knee) - 21min   Anesthesia type: Choice   Pre-op diagnosis: right knee osteoarthritis   Location: [facility] ROOM 10 / [facility]   Surgeons: Gaynelle Arabian, MD      DISCUSSION:80 y.o. former smoker with h/o HTN, asthma, breast cancer, CAD, right knee OA scheduled for above procedure 09/14/20 with Dr. Gaynelle Arabian.   Pt last seen by cardiology for preoperative evaluation.  Per OV note, "She is having TKR next month -she walks 4 miles daily without any problems -her functional capacity is good and she is able to complete 7.01 mets, therefore no further nonischemic evaluation is indicated -she is low risk by the revised cardiac risk index (RCRI) with a 0.9% perioperative risk of major cardiac event."  Anticipate pt can proceed with planned procedure barring acute status change.   VS: BP (!) 150/81   Pulse 64   Temp 36.7 C (Oral)   Ht 5\' 4"  (1.626 m)   Wt 59.4 kg   SpO2 98%   BMI 22.49 kg/m   PROVIDERS: Leeroy Cha, MD is PCP   Fransico Him, MD is Cardiologist  LABS: Labs reviewed: Acceptable for surgery. (all labs ordered are listed, but only abnormal results are displayed)  Labs Reviewed  COMPREHENSIVE METABOLIC PANEL - Abnormal; Notable for the following components:      Result Value   Glucose, Bld 107 (*)    ALT 46 (*)    All other components within normal limits  SURGICAL PCR SCREEN  CBC  PROTIME-INR  APTT  TYPE AND SCREEN     IMAGES:   EKG: 11/18/2019 Rate 67 bpm  NSR Nonspecific ST abnormality  CV: CT Coronary  IMPRESSION: 1.CT FFR flow analysis demonstrates no hemodynamically flow limiting lesions. Past Medical History:  Diagnosis Date  . Arthritis    oa  . Asthma   . Breast cancer ([place])   . CAD (coronary artery disease), native coronary artery    coronary CTA 2021 with 25-49% mLAD stenosis and normal FFR  . Cancer [facility]) 2009    BREAST left partial mastectomy and radiation  . Clinically significant macular edema with cotton wool spots    takes preservision for  . Cold within last month   spitting up clear mucous now, no fevers  . Dyspnea    with exertion only  . Hearing loss    both ears  . Hemorrhoids   . HLD (hyperlipidemia)    LDL goal < 70  . Hypertension   . Personal history of radiation therapy     Past Surgical History:  Procedure Laterality Date  . BREAST LUMPECTOMY    . colonscopy    . TONSILLECTOMY AND ADENOIDECTOMY  age 8  . TOTAL KNEE ARTHROPLASTY Left 07/04/2016   Procedure: LEFT TOTAL KNEE ARTHROPLASTY;  Surgeon: Gaynelle Arabian, MD;  Location: [facility];  Service: Orthopedics;  Laterality: Left;  . varicose vein saline injections      MEDICATIONS: . acetaminophen (TYLENOL) 500 MG tablet  . albuterol (PROVENTIL HFA;VENTOLIN HFA) 108 (90 Base) MCG/ACT inhaler  . Artificial Tear Solution (SOOTHE XP) SOLN  . aspirin EC 81 MG tablet  . atorvastatin (LIPITOR) 20 MG tablet  . Biotin 5000 MCG SUBL  . bisacodyl (DULCOLAX) 5 MG EC tablet  . budesonide-formoterol (SYMBICORT) 80-4.5 MCG/ACT inhaler  . calcium carbonate (TUMS - DOSED IN MG ELEMENTAL CALCIUM) 500 MG chewable tablet  . fexofenadine (ALLEGRA) 180 MG  tablet  . fluticasone (FLONASE) 50 MCG/ACT nasal spray  . hydrochlorothiazide (MICROZIDE) 12.5 MG capsule  . ibuprofen (ADVIL) 200 MG tablet  . ketotifen (ZADITOR) 0.025 % ophthalmic solution  . Metamucil Fiber CHEW  . Misc Natural Products (GLUCOSAMINE CHOND CMP TRIPLE PO)  . Multiple Vitamin (MULTIVITAMIN) capsule  . Multiple Vitamins-Minerals (PRESERVISION AREDS 2 PO)  . naproxen sodium (ALEVE) 220 MG tablet  . Omega-3 Fatty Acids (FISH OIL) 1200 MG CAPS  . oxymetazoline (AFRIN) 0.05 % nasal spray  . polyethylene glycol (MIRALAX / GLYCOLAX) 17 g packet  . predniSONE (DELTASONE) 20 MG tablet  . sodium chloride (OCEAN) 0.65 % SOLN nasal spray   No current facility-administered  medications for this encounter.    Konrad Felix, PA-C [facility] Pre-Surgical Testing [phone]

## 2020-09-10 ENCOUNTER — Other Ambulatory Visit (HOSPITAL_COMMUNITY)
Admission: RE | Admit: 2020-09-10 | Discharge: 2020-09-10 | Disposition: A | Discharge status: Home / Self Care | Payer: Medicare Other | Source: Ambulatory Visit | Attending: Orthopedic Surgery | Admitting: Orthopedic Surgery

## 2020-09-10 DIAGNOSIS — Z01812 Encounter for preprocedural laboratory examination: Secondary | ICD-10-CM | POA: Diagnosis not present

## 2020-09-10 DIAGNOSIS — Z20822 Contact with and (suspected) exposure to covid-19: Secondary | ICD-10-CM | POA: Insufficient documentation

## 2020-09-10 LAB — SARS CORONAVIRUS 2 (TAT 6-24 HRS): SARS Coronavirus 2: NEGATIVE

## 2020-09-13 MED ORDER — BUPIVACAINE LIPOSOME 1.3 % IJ SUSP
20.0000 mL | Freq: Once | INTRAMUSCULAR | Status: DC
  Filled 2020-09-13: qty 20

## 2020-09-14 ENCOUNTER — Ambulatory Visit (HOSPITAL_COMMUNITY): Payer: Medicare Other | Admitting: Certified Registered Nurse Anesthetist

## 2020-09-14 ENCOUNTER — Encounter (HOSPITAL_COMMUNITY): Payer: Self-pay | Admitting: Orthopedic Surgery

## 2020-09-14 ENCOUNTER — Other Ambulatory Visit: Payer: Self-pay

## 2020-09-14 ENCOUNTER — Encounter (HOSPITAL_COMMUNITY)
Admission: RE | Disposition: A | Discharge status: Home / Self Care | Payer: Self-pay | Source: Home / Self Care | Attending: Orthopedic Surgery

## 2020-09-14 ENCOUNTER — Ambulatory Visit (HOSPITAL_COMMUNITY)
Admission: RE | Admit: 2020-09-14 | Discharge: 2020-09-15 | Disposition: A | Discharge status: Home / Self Care | Payer: Medicare Other | Attending: Orthopedic Surgery | Admitting: Orthopedic Surgery

## 2020-09-14 ENCOUNTER — Ambulatory Visit (HOSPITAL_COMMUNITY): Payer: Medicare Other | Admitting: Physician Assistant

## 2020-09-14 DIAGNOSIS — Z7951 Long term (current) use of inhaled steroids: Secondary | ICD-10-CM | POA: Diagnosis not present

## 2020-09-14 DIAGNOSIS — M1711 Unilateral primary osteoarthritis, right knee: Secondary | ICD-10-CM | POA: Insufficient documentation

## 2020-09-14 DIAGNOSIS — Z79899 Other long term (current) drug therapy: Secondary | ICD-10-CM | POA: Insufficient documentation

## 2020-09-14 DIAGNOSIS — Z96652 Presence of left artificial knee joint: Secondary | ICD-10-CM | POA: Insufficient documentation

## 2020-09-14 DIAGNOSIS — M179 Osteoarthritis of knee, unspecified: Secondary | ICD-10-CM | POA: Diagnosis present

## 2020-09-14 DIAGNOSIS — Z853 Personal history of malignant neoplasm of breast: Secondary | ICD-10-CM | POA: Diagnosis not present

## 2020-09-14 DIAGNOSIS — G8918 Other acute postprocedural pain: Secondary | ICD-10-CM | POA: Diagnosis not present

## 2020-09-14 DIAGNOSIS — I2510 Atherosclerotic heart disease of native coronary artery without angina pectoris: Secondary | ICD-10-CM | POA: Diagnosis not present

## 2020-09-14 DIAGNOSIS — Z87891 Personal history of nicotine dependence: Secondary | ICD-10-CM | POA: Diagnosis not present

## 2020-09-14 DIAGNOSIS — M1710 Unilateral primary osteoarthritis, unspecified knee: Secondary | ICD-10-CM | POA: Diagnosis present

## 2020-09-14 DIAGNOSIS — I10 Essential (primary) hypertension: Secondary | ICD-10-CM | POA: Diagnosis not present

## 2020-09-14 HISTORY — PX: TOTAL KNEE ARTHROPLASTY: SHX125

## 2020-09-14 SURGERY — ARTHROPLASTY, KNEE, TOTAL
Anesthesia: Monitor Anesthesia Care | Site: Knee | Laterality: Right

## 2020-09-14 MED ORDER — CEFAZOLIN SODIUM-DEXTROSE 2-4 GM/100ML-% IV SOLN
2.0000 g | Freq: Four times a day (QID) | INTRAVENOUS | Status: AC
  Administered 2020-09-14 (×2): 2 g via INTRAVENOUS
  Filled 2020-09-14 (×2): qty 100

## 2020-09-14 MED ORDER — ONDANSETRON HCL 4 MG/2ML IJ SOLN: 4.0000 mg | Freq: Once | INTRAMUSCULAR | Status: DC | PRN

## 2020-09-14 MED ORDER — SODIUM CHLORIDE (PF) 0.9 % IJ SOLN
INTRAMUSCULAR | Status: DC | PRN
  Administered 2020-09-14: 60 mL

## 2020-09-14 MED ORDER — PROPOFOL 10 MG/ML IV BOLUS
INTRAVENOUS | Status: DC | PRN
  Administered 2020-09-14: 30 mg via INTRAVENOUS

## 2020-09-14 MED ORDER — METOCLOPRAMIDE HCL 5 MG/ML IJ SOLN: 5.0000 mg | Freq: Three times a day (TID) | INTRAMUSCULAR | Status: DC | PRN

## 2020-09-14 MED ORDER — PROPOFOL 500 MG/50ML IV EMUL
INTRAVENOUS | Status: DC | PRN
  Administered 2020-09-14: 75 ug/kg/min via INTRAVENOUS

## 2020-09-14 MED ORDER — ALBUTEROL SULFATE HFA 108 (90 BASE) MCG/ACT IN AERS: 2.0000 | INHALATION_SPRAY | RESPIRATORY_TRACT | Status: DC | PRN

## 2020-09-14 MED ORDER — BUPIVACAINE LIPOSOME 1.3 % IJ SUSP
INTRAMUSCULAR | Status: DC | PRN
  Administered 2020-09-14: 20 mL

## 2020-09-14 MED ORDER — HYDROMORPHONE HCL 1 MG/ML IJ SOLN: 0.2500 mg | INTRAMUSCULAR | Status: DC | PRN

## 2020-09-14 MED ORDER — STERILE WATER FOR IRRIGATION IR SOLN
Status: DC | PRN
  Administered 2020-09-14: 2000 mL

## 2020-09-14 MED ORDER — DEXAMETHASONE SODIUM PHOSPHATE 10 MG/ML IJ SOLN
INTRAMUSCULAR | Status: DC | PRN
  Administered 2020-09-14: 5 mg

## 2020-09-14 MED ORDER — ACETAMINOPHEN 10 MG/ML IV SOLN
1000.0000 mg | Freq: Four times a day (QID) | INTRAVENOUS | Status: DC
  Administered 2020-09-14: 1000 mg via INTRAVENOUS
  Filled 2020-09-14: qty 100

## 2020-09-14 MED ORDER — ORAL CARE MOUTH RINSE
15.0000 mL | Freq: Once | OROMUCOSAL | Status: AC
  Administered 2020-09-14: 15 mL via OROMUCOSAL

## 2020-09-14 MED ORDER — DIPHENHYDRAMINE HCL 12.5 MG/5ML PO ELIX: 12.5000 mg | ORAL_SOLUTION | ORAL | Status: DC | PRN

## 2020-09-14 MED ORDER — HYDROCHLOROTHIAZIDE 12.5 MG PO CAPS
12.5000 mg | ORAL_CAPSULE | Freq: Every day | ORAL | Status: DC
  Administered 2020-09-15: 12.5 mg via ORAL
  Filled 2020-09-14: qty 1

## 2020-09-14 MED ORDER — ONDANSETRON HCL 4 MG PO TABS: 4.0000 mg | ORAL_TABLET | Freq: Four times a day (QID) | ORAL | Status: DC | PRN

## 2020-09-14 MED ORDER — RIVAROXABAN 10 MG PO TABS
10.0000 mg | ORAL_TABLET | Freq: Every day | ORAL | Status: DC
  Administered 2020-09-15: 10 mg via ORAL
  Filled 2020-09-14: qty 1

## 2020-09-14 MED ORDER — LORATADINE 10 MG PO TABS
10.0000 mg | ORAL_TABLET | Freq: Every day | ORAL | Status: DC
  Administered 2020-09-15: 10 mg via ORAL
  Filled 2020-09-14: qty 1

## 2020-09-14 MED ORDER — MOMETASONE FURO-FORMOTEROL FUM 100-5 MCG/ACT IN AERO
2.0000 | INHALATION_SPRAY | Freq: Two times a day (BID) | RESPIRATORY_TRACT | Status: DC
  Administered 2020-09-14 – 2020-09-15 (×2): 2 via RESPIRATORY_TRACT
  Filled 2020-09-14: qty 8.8

## 2020-09-14 MED ORDER — DEXAMETHASONE SODIUM PHOSPHATE 10 MG/ML IJ SOLN
10.0000 mg | Freq: Once | INTRAMUSCULAR | Status: AC
  Administered 2020-09-15: 10 mg via INTRAVENOUS
  Filled 2020-09-14: qty 1

## 2020-09-14 MED ORDER — METHOCARBAMOL 500 MG PO TABS
500.0000 mg | ORAL_TABLET | Freq: Four times a day (QID) | ORAL | Status: DC | PRN
  Administered 2020-09-14 – 2020-09-15 (×3): 500 mg via ORAL
  Filled 2020-09-14 (×3): qty 1

## 2020-09-14 MED ORDER — POVIDONE-IODINE 10 % EX SWAB
2.0000 "application " | Freq: Once | CUTANEOUS | Status: AC
  Administered 2020-09-14: 2 via TOPICAL

## 2020-09-14 MED ORDER — METOCLOPRAMIDE HCL 5 MG PO TABS: 5.0000 mg | ORAL_TABLET | Freq: Three times a day (TID) | ORAL | Status: DC | PRN

## 2020-09-14 MED ORDER — SODIUM CHLORIDE (PF) 0.9 % IJ SOLN
INTRAMUSCULAR | Status: AC
  Filled 2020-09-14: qty 10

## 2020-09-14 MED ORDER — DEXAMETHASONE SODIUM PHOSPHATE 10 MG/ML IJ SOLN
8.0000 mg | Freq: Once | INTRAMUSCULAR | Status: AC
  Administered 2020-09-14: 8 mg via INTRAVENOUS

## 2020-09-14 MED ORDER — EPHEDRINE SULFATE-NACL 50-0.9 MG/10ML-% IV SOSY
PREFILLED_SYRINGE | INTRAVENOUS | Status: DC | PRN
  Administered 2020-09-14 (×3): 5 mg via INTRAVENOUS

## 2020-09-14 MED ORDER — ACETAMINOPHEN 500 MG PO TABS
1000.0000 mg | ORAL_TABLET | Freq: Four times a day (QID) | ORAL | Status: AC
Start: 2020-09-14 — End: 2020-09-15
  Administered 2020-09-14 – 2020-09-15 (×4): 1000 mg via ORAL
  Filled 2020-09-14 (×4): qty 2

## 2020-09-14 MED ORDER — ROPIVACAINE HCL 7.5 MG/ML IJ SOLN
INTRAMUSCULAR | Status: DC | PRN
  Administered 2020-09-14: 20 mL via PERINEURAL

## 2020-09-14 MED ORDER — BISACODYL 10 MG RE SUPP: 10.0000 mg | Freq: Every day | RECTAL | Status: DC | PRN

## 2020-09-14 MED ORDER — DOCUSATE SODIUM 100 MG PO CAPS
100.0000 mg | ORAL_CAPSULE | Freq: Two times a day (BID) | ORAL | Status: DC
  Administered 2020-09-14 – 2020-09-15 (×2): 100 mg via ORAL
  Filled 2020-09-14 (×2): qty 1

## 2020-09-14 MED ORDER — CEFAZOLIN SODIUM-DEXTROSE 2-4 GM/100ML-% IV SOLN
2.0000 g | INTRAVENOUS | Status: AC
  Administered 2020-09-14: 2 g via INTRAVENOUS
  Filled 2020-09-14: qty 100

## 2020-09-14 MED ORDER — ATORVASTATIN CALCIUM 20 MG PO TABS
20.0000 mg | ORAL_TABLET | Freq: Every day | ORAL | Status: DC
  Administered 2020-09-14 – 2020-09-15 (×2): 20 mg via ORAL
  Filled 2020-09-14 (×2): qty 1

## 2020-09-14 MED ORDER — ONDANSETRON HCL 4 MG/2ML IJ SOLN
INTRAMUSCULAR | Status: DC | PRN
  Administered 2020-09-14: 4 mg via INTRAVENOUS

## 2020-09-14 MED ORDER — POLYETHYLENE GLYCOL 3350 17 G PO PACK: 17.0000 g | PACK | Freq: Every day | ORAL | Status: DC | PRN

## 2020-09-14 MED ORDER — LACTATED RINGERS IV SOLN: INTRAVENOUS | Status: DC

## 2020-09-14 MED ORDER — OXYCODONE HCL 5 MG PO TABS
5.0000 mg | ORAL_TABLET | ORAL | Status: DC | PRN
  Administered 2020-09-15: 5 mg via ORAL
  Filled 2020-09-14: qty 1

## 2020-09-14 MED ORDER — SODIUM CHLORIDE 0.9 % IV SOLN: INTRAVENOUS | Status: DC

## 2020-09-14 MED ORDER — DEXAMETHASONE SODIUM PHOSPHATE 10 MG/ML IJ SOLN
INTRAMUSCULAR | Status: AC
  Filled 2020-09-14: qty 1

## 2020-09-14 MED ORDER — METHOCARBAMOL 500 MG IVPB - SIMPLE MED
500.0000 mg | Freq: Four times a day (QID) | INTRAVENOUS | Status: DC | PRN
  Filled 2020-09-14: qty 50

## 2020-09-14 MED ORDER — PHENYLEPHRINE HCL-NACL 10-0.9 MG/250ML-% IV SOLN
INTRAVENOUS | Status: DC | PRN
  Administered 2020-09-14: 20 ug/min via INTRAVENOUS

## 2020-09-14 MED ORDER — MIDAZOLAM HCL 2 MG/2ML IJ SOLN: 1.0000 mg | INTRAMUSCULAR | Status: DC

## 2020-09-14 MED ORDER — ACETAMINOPHEN 10 MG/ML IV SOLN: 1000.0000 mg | Freq: Once | INTRAVENOUS | Status: DC | PRN

## 2020-09-14 MED ORDER — TRANEXAMIC ACID-NACL 1000-0.7 MG/100ML-% IV SOLN
1000.0000 mg | INTRAVENOUS | Status: AC
  Administered 2020-09-14: 1000 mg via INTRAVENOUS
  Filled 2020-09-14: qty 100

## 2020-09-14 MED ORDER — PHENOL 1.4 % MT LIQD: 1.0000 | OROMUCOSAL | Status: DC | PRN

## 2020-09-14 MED ORDER — SODIUM CHLORIDE 0.9 % IR SOLN
Status: DC | PRN
  Administered 2020-09-14: 1000 mL

## 2020-09-14 MED ORDER — FLEET ENEMA 7-19 GM/118ML RE ENEM: 1.0000 | ENEMA | Freq: Once | RECTAL | Status: DC | PRN

## 2020-09-14 MED ORDER — MENTHOL 3 MG MT LOZG: 1.0000 | LOZENGE | OROMUCOSAL | Status: DC | PRN

## 2020-09-14 MED ORDER — KETOTIFEN FUMARATE 0.025 % OP SOLN
1.0000 [drp] | Freq: Two times a day (BID) | OPHTHALMIC | Status: DC | PRN
  Filled 2020-09-14: qty 5

## 2020-09-14 MED ORDER — POLYVINYL ALCOHOL 1.4 % OP SOLN
1.0000 [drp] | Freq: Every day | OPHTHALMIC | Status: DC | PRN
  Filled 2020-09-14: qty 15

## 2020-09-14 MED ORDER — FLUTICASONE PROPIONATE 50 MCG/ACT NA SUSP
1.0000 | Freq: Every day | NASAL | Status: DC | PRN
  Filled 2020-09-14: qty 16

## 2020-09-14 MED ORDER — BUPIVACAINE IN DEXTROSE 0.75-8.25 % IT SOLN
INTRATHECAL | Status: DC | PRN
  Administered 2020-09-14: 1.6 mL via INTRATHECAL

## 2020-09-14 MED ORDER — MORPHINE SULFATE (PF) 2 MG/ML IV SOLN: 1.0000 mg | INTRAVENOUS | Status: DC | PRN

## 2020-09-14 MED ORDER — OXYCODONE HCL 5 MG/5ML PO SOLN: 5.0000 mg | Freq: Once | ORAL | Status: DC | PRN

## 2020-09-14 MED ORDER — CHLORHEXIDINE GLUCONATE 0.12 % MT SOLN: 15.0000 mL | Freq: Once | OROMUCOSAL | Status: AC

## 2020-09-14 MED ORDER — ONDANSETRON HCL 4 MG/2ML IJ SOLN: 4.0000 mg | Freq: Four times a day (QID) | INTRAMUSCULAR | Status: DC | PRN

## 2020-09-14 MED ORDER — OXYCODONE HCL 5 MG PO TABS
10.0000 mg | ORAL_TABLET | ORAL | Status: DC | PRN
  Administered 2020-09-14 – 2020-09-15 (×4): 10 mg via ORAL
  Filled 2020-09-14 (×4): qty 2

## 2020-09-14 MED ORDER — PROPOFOL 1000 MG/100ML IV EMUL
INTRAVENOUS | Status: AC
  Filled 2020-09-14: qty 100

## 2020-09-14 MED ORDER — MIDAZOLAM HCL 2 MG/2ML IJ SOLN
INTRAMUSCULAR | Status: AC
  Administered 2020-09-14: 1 mg via INTRAVENOUS
  Filled 2020-09-14: qty 2

## 2020-09-14 MED ORDER — 0.9 % SODIUM CHLORIDE (POUR BTL) OPTIME
TOPICAL | Status: DC | PRN
  Administered 2020-09-14: 1000 mL

## 2020-09-14 MED ORDER — FENTANYL CITRATE (PF) 100 MCG/2ML IJ SOLN
INTRAMUSCULAR | Status: AC
  Administered 2020-09-14: 50 ug via INTRAVENOUS
  Filled 2020-09-14: qty 2

## 2020-09-14 MED ORDER — ONDANSETRON HCL 4 MG/2ML IJ SOLN
INTRAMUSCULAR | Status: AC
  Filled 2020-09-14: qty 2

## 2020-09-14 MED ORDER — OXYCODONE HCL 5 MG PO TABS: 5.0000 mg | ORAL_TABLET | Freq: Once | ORAL | Status: DC | PRN

## 2020-09-14 MED ORDER — FENTANYL CITRATE (PF) 100 MCG/2ML IJ SOLN: 50.0000 ug | INTRAMUSCULAR | Status: DC

## 2020-09-14 SURGICAL SUPPLY — 55 items
ATTUNE PS FEM RT SZ 5 CEM KNEE (Femur) ×1 IMPLANT
ATTUNE PSRP INSR SZ5 8 KNEE (Insert) ×1 IMPLANT
BAG SPEC THK2 15X12 ZIP CLS (MISCELLANEOUS) ×1
BAG ZIPLOCK 12X15 (MISCELLANEOUS) ×2 IMPLANT
BASEPLATE TIBIAL ROTATING SZ 4 (Knees) ×1 IMPLANT
BLADE SAG 18X100X1.27 (BLADE) ×2 IMPLANT
BLADE SAW SGTL 11.0X1.19X90.0M (BLADE) ×2 IMPLANT
BNDG ELASTIC 6X5.8 VLCR STR LF (GAUZE/BANDAGES/DRESSINGS) ×2 IMPLANT
BOWL SMART MIX CTS (DISPOSABLE) ×2 IMPLANT
BSPLAT TIB 4 CMNT ROT PLAT STR (Knees) ×1 IMPLANT
CEMENT HV SMART SET (Cement) ×4 IMPLANT
COVER SURGICAL LIGHT HANDLE (MISCELLANEOUS) ×2 IMPLANT
COVER WAND RF STERILE (DRAPES) IMPLANT
CUFF TOURN SGL QUICK 34 (TOURNIQUET CUFF) ×2
CUFF TRNQT CYL 34X4.125X (TOURNIQUET CUFF) ×1 IMPLANT
DECANTER SPIKE VIAL GLASS SM (MISCELLANEOUS) ×2 IMPLANT
DRAPE U-SHAPE 47X51 STRL (DRAPES) ×2 IMPLANT
DRESSING AQUACEL AG SP 3.5X10 (GAUZE/BANDAGES/DRESSINGS) IMPLANT
DRSG AQUACEL AG ADV 3.5X10 (GAUZE/BANDAGES/DRESSINGS) ×2 IMPLANT
DRSG AQUACEL AG SP 3.5X10 (GAUZE/BANDAGES/DRESSINGS) ×2
DURAPREP 26ML APPLICATOR (WOUND CARE) ×2 IMPLANT
ELECT REM PT RETURN 15FT ADLT (MISCELLANEOUS) ×2 IMPLANT
GLOVE SRG 8 PF TXTR STRL LF DI (GLOVE) ×1 IMPLANT
GLOVE SURG ENC MOIS LTX SZ6 (GLOVE) IMPLANT
GLOVE SURG ENC MOIS LTX SZ7 (GLOVE) ×2 IMPLANT
GLOVE SURG ENC MOIS LTX SZ8 (GLOVE) ×2 IMPLANT
GLOVE SURG UNDER POLY LF SZ6.5 (GLOVE) ×1 IMPLANT
GLOVE SURG UNDER POLY LF SZ8 (GLOVE) ×2
GLOVE SURG UNDER POLY LF SZ8.5 (GLOVE) ×2 IMPLANT
GOWN STRL REUS W/TWL LRG LVL3 (GOWN DISPOSABLE) ×4 IMPLANT
GOWN STRL REUS W/TWL XL LVL3 (GOWN DISPOSABLE) ×2 IMPLANT
HANDPIECE INTERPULSE COAX TIP (DISPOSABLE) ×2
HOLDER FOLEY CATH W/STRAP (MISCELLANEOUS) ×1 IMPLANT
IMMOBILIZER KNEE 20 (SOFTGOODS) ×2
IMMOBILIZER KNEE 20 THIGH 36 (SOFTGOODS) ×1 IMPLANT
KIT TURNOVER KIT A (KITS) ×2 IMPLANT
MANIFOLD NEPTUNE II (INSTRUMENTS) ×2 IMPLANT
NS IRRIG 1000ML POUR BTL (IV SOLUTION) ×2 IMPLANT
PACK TOTAL KNEE CUSTOM (KITS) ×2 IMPLANT
PADDING CAST COTTON 6X4 STRL (CAST SUPPLIES) ×3 IMPLANT
PATELLA MEDIAL ATTUN 35MM KNEE (Knees) ×1 IMPLANT
PENCIL SMOKE EVACUATOR (MISCELLANEOUS) ×2 IMPLANT
PIN DRILL FIX HALF THREAD (BIT) ×1 IMPLANT
PIN STEINMAN FIXATION KNEE (PIN) ×1 IMPLANT
PROTECTOR NERVE ULNAR (MISCELLANEOUS) ×2 IMPLANT
SET HNDPC FAN SPRY TIP SCT (DISPOSABLE) ×1 IMPLANT
STRIP CLOSURE SKIN 1/2X4 (GAUZE/BANDAGES/DRESSINGS) ×4 IMPLANT
SUT MNCRL AB 4-0 PS2 18 (SUTURE) ×2 IMPLANT
SUT STRATAFIX 0 PDS 27 VIOLET (SUTURE) ×2
SUT VIC AB 2-0 CT1 27 (SUTURE) ×6
SUT VIC AB 2-0 CT1 TAPERPNT 27 (SUTURE) ×3 IMPLANT
SUTURE STRATFX 0 PDS 27 VIOLET (SUTURE) ×1 IMPLANT
TRAY FOLEY MTR SLVR 14FR STAT (SET/KITS/TRAYS/PACK) ×1 IMPLANT
WATER STERILE IRR 1000ML POUR (IV SOLUTION) ×4 IMPLANT
WRAP KNEE MAXI GEL POST OP (GAUZE/BANDAGES/DRESSINGS) ×2 IMPLANT

## 2020-09-14 NOTE — Progress Notes (Signed)
AssistedDr. Greg Stoltzfus with right, ultrasound guided, adductor canal block. Side rails up, monitors on throughout procedure. See vital signs in flow sheet. Tolerated Procedure well.  

## 2020-09-14 NOTE — Anesthesia Procedure Notes (Signed)
Anesthesia Regional Block: Adductor canal block   Pre-Anesthetic Checklist: ,, timeout performed, Correct Patient, Correct Site, Correct Laterality, Correct Procedure, Correct Position, site marked, Risks and benefits discussed,  Surgical consent,  Pre-op evaluation,  At surgeon's request and post-op pain management  Laterality: Right  Prep: Dura Prep       Needles:  Injection technique: Single-shot  Needle Type: Echogenic Stimulator Needle     Needle Length: 9cm  Needle Gauge: 20     Additional Needles:   Procedures:,,,, ultrasound used (permanent image in chart),,,,  Narrative:  Start time: 09/14/2020 7:04 AM End time: 09/14/2020 7:06 AM Injection made incrementally with aspirations every 5 mL.  Performed by: Personally  Anesthesiologist: Darral Dash, DO  Additional Notes: Patient identified. Risks/Benefits/Options discussed with patient including but not limited to bleeding, infection, nerve damage, failed block, incomplete pain control. Patient expressed understanding and wished to proceed. All questions were answered. Sterile technique was used throughout the entire procedure. Please see nursing notes for vital signs. Aspirated in 5cc intervals with injection for negative confirmation. Patient was given instructions on fall risk and not to get out of bed. All questions and concerns addressed with instructions to call with any issues or inadequate analgesia.

## 2020-09-14 NOTE — Anesthesia Procedure Notes (Signed)
Spinal  Patient location during procedure: OR End time: 09/14/2020 8:20 AM Staffing Performed: resident/CRNA  Anesthesiologist: Darral Dash, DO Resident/CRNA: Maxwell Caul, CRNA Preanesthetic Checklist Completed: patient identified, IV checked, site marked, risks and benefits discussed, surgical consent, monitors and equipment checked, pre-op evaluation and timeout performed Spinal Block Patient position: sitting Prep: DuraPrep Patient monitoring: heart rate, cardiac monitor, continuous pulse ox and blood pressure Approach: midline Location: L3-4 Injection technique: single-shot Needle Needle type: Pencan  Needle gauge: 24 G Needle length: 10 cm Assessment Sensory level: T4 Additional Notes IV functioning, monitors applied to pt. Expiration date of kit checked and confirmed to be in date. Sterile prep and drape, hand hygiene and sterile gloved used. Pt was positioned and spine was prepped in sterile fashion. Skin was anesthetized with lidocaine. Free flow of clear CSF obtained prior to injecting local anesthetic into CSF x 1 attempt. Spinal needle aspirated freely following injection. Needle was carefully withdrawn, and pt tolerated procedure well. Loss of motor and sensory on exam post injection. Dr Gloris Manchester at bedside during entire placement.

## 2020-09-14 NOTE — Evaluation (Signed)
Physical Therapy Evaluation Patient Details Name: Tammy Holland MRN: [id number] DOB: 1940/09/27 Today's Date: 09/14/2020   History of Present Illness  Patient is a 80 y.o. female s/p Rt TKA on 09/14/2020 with PMH significant for HTN, HLD, hearing loss, dyspnea, breast cancer, CAD, asthma, OA, and Lt TKA (2017).  Clinical Impression  Pt is a 79y.o. female s/p Rt TKA POD 0. Pt reports that she is independent with mobility at baseline. Pt required MIN-MOD assist for rise and steadying with sit to stand transfers 2/2 muscle weakness as result of spinal block. Pt required MIN assist for ambulation 48ft with verbal cues for RW management and step to gait pattern with no LOB or knee buckling. PT reviewed therapeutic interventions for promotion of DVT prevention, pt demonstrated udnerstanding. Pt resides with her husband at an independent living facility. Recommend home with family support and RW. Pt will benefit from skilled PT to increase independence and safety with mobility. Acute therapy to follow up during stay.      Follow Up Recommendations Follow surgeon's recommendation for DC plan and follow-up therapies;Outpatient PT    Equipment Recommendations  Rolling walker with 5" wheels    Recommendations for Other Services       Precautions / Restrictions Precautions Precautions: Fall Restrictions Weight Bearing Restrictions: No Other Position/Activity Restrictions: WBAT      Mobility  Bed Mobility Overal bed mobility: Needs Assistance Bed Mobility: Supine to Sit     Supine to sit: Supervision     General bed mobility comments: use of B UEs and bed rail to scoot to EOB with supervision for safety    Transfers Overall transfer level: Needs assistance Equipment used: Rolling walker (2 wheeled) Transfers: Sit to/from Stand Sit to Stand: Min assist;Mod assist;+2 safety/equipment;+2 physical assistance         General transfer comment: x2 from EOB; pt experiencing proximal hip  muscle weakness 2/2 spinal block. Pt required MOD assist +1 for 1st sit to stand and did not achieve full erect standing position. MIN assist +2 for 2nd attempt for power up to stand and stabiltiy in standing  Ambulation/Gait Ambulation/Gait assistance: Min assist Gait Distance (Feet): 30 Feet Assistive device: Rolling walker (2 wheeled) Gait Pattern/deviations: Step-to pattern;Decreased weight shift to right;Narrow base of support;Decreased stride length Gait velocity: decr   General Gait Details: Pt performed pre gait marching with MIN assist and use of B UEs on RW with no knee buckling or LOB. MIN assist for safety during ambulation with cues for sequencing and to maintain safe proximity to RW.  Stairs            Wheelchair Mobility    Modified Rankin (Stroke Patients Only)       Balance Overall balance assessment: Needs assistance Sitting-balance support: Feet supported Sitting balance-Leahy Scale: Good     Standing balance support: Bilateral upper extremity supported;During functional activity Standing balance-Leahy Scale: Poor Standing balance comment: use of RW to maintain standing balance                             Pertinent Vitals/Pain Pain Assessment: 0-10 Pain Score: 3  Pain Location: Rt knee Pain Descriptors / Indicators: Sore;Pressure Pain Intervention(s): Limited activity within patient's tolerance;Monitored during session;Repositioned    Home Living Family/patient expects to be discharged to:: Private residence Living Arrangements: Spouse/significant other Available Help at Discharge: Family Type of Home: Independent living facility ([facility] (small house)) Home Access: Level entry  Home Layout: One level Home Equipment: None Additional Comments: pt lives with husband and has daughter who is available intermittently    Prior Function Level of Independence: Independent               Hand Dominance   Dominant  Hand: Right    Extremity/Trunk Assessment   Upper Extremity Assessment Upper Extremity Assessment: Overall WFL for tasks assessed    Lower Extremity Assessment Lower Extremity Assessment: RLE deficits/detail RLE Deficits / Details: pt with good quad set strength, 4+/5 B dorsiflexion and Lt plantar flexion strength, and 4-/5 Rt plantarflexion strength. RLE Sensation: decreased proprioception (proximal hip musculature) RLE Coordination: WNL    Cervical / Trunk Assessment Cervical / Trunk Assessment: Normal  Communication   Communication: No difficulties  Cognition Arousal/Alertness: Awake/alert Behavior During Therapy: WFL for tasks assessed/performed Overall Cognitive Status: Within Functional Limits for tasks assessed                                        General Comments      Exercises Total Joint Exercises Ankle Circles/Pumps: AROM;Both;20 reps;Seated Quad Sets: AROM;Right;10 reps;Seated   Assessment/Plan    PT Assessment Patient needs continued PT services  PT Problem List Decreased strength;Decreased range of motion;Decreased activity tolerance;Decreased balance;Decreased mobility;Decreased safety awareness;Decreased knowledge of use of DME;Pain       PT Treatment Interventions DME instruction;Gait training;Stair training;Functional mobility training;Therapeutic activities;Therapeutic exercise;Balance training;Patient/family education    PT Goals (Current goals can be found in the Care Plan section)  Acute Rehab PT Goals Patient Stated Goal: returnt o being active- going on walks, exercise classes PT Goal Formulation: With patient Time For Goal Achievement: 09/21/20 Potential to Achieve Goals: Good    Frequency 7X/week   Barriers to discharge        Co-evaluation               AM-PAC PT "6 Clicks" Mobility  Outcome Measure Help needed turning from your back to your side while in a flat bed without using bedrails?: None Help needed  moving from lying on your back to sitting on the side of a flat bed without using bedrails?: A Little Help needed moving to and from a bed to a chair (including a wheelchair)?: A Little Help needed standing up from a chair using your arms (e.g., wheelchair or bedside chair)?: A Little Help needed to walk in hospital room?: A Little Help needed climbing 3-5 steps with a railing? : A Lot 6 Click Score: 18    End of Session Equipment Utilized During Treatment: Gait belt Activity Tolerance: Patient tolerated treatment well Patient left: in chair;with call bell/phone within reach;with chair alarm set Nurse Communication: Mobility status (+2 for return to bed) PT Visit Diagnosis: Unsteadiness on feet (R26.81);Muscle weakness (generalized) (M62.81);Pain Pain - Right/Left: Right Pain - part of body: Knee    Time: 8527-7824 PT Time Calculation (min) (ACUTE ONLY): 21 min   Charges:              Elna Breslow, SPT  Acute rehab    Lauren Youngblood 09/14/2020, 3:20 PM

## 2020-09-14 NOTE — Progress Notes (Signed)
Orthopedic Tech Progress Note Patient Details:  Tammy Holland [facility] 1941-05-24 [id number]  Patient ID: Tammy Holland, female   DOB: 11-Mar-1941, 80 y.o.   MRN: [id number]   Tammy Holland 09/14/2020, 10:12 AM Pt placed in cpm in PACU @1010 .

## 2020-09-14 NOTE — Op Note (Signed)
OPERATIVE REPORT-TOTAL KNEE ARTHROPLASTY   Pre-operative diagnosis- Osteoarthritis  Right knee(s)  Post-operative diagnosis- Osteoarthritis Right knee(s)  Procedure-  Right  Total Knee Arthroplasty  Surgeon- [name]. Naiya Corral, MD  Assistant- Molli Barrows, PA-C   Anesthesia-  Adductor canal block and spinal  EBL- 25 ml   Drains None  Tourniquet time-  Total Tourniquet Time Documented: Thigh (Right) - 28 minutes Total: Thigh (Right) - 28 minutes     Complications- None  Condition-PACU - hemodynamically stable.   Brief Clinical Note  Tammy Holland is a 80 y.o. year old female with end stage OA of her right knee with progressively worsening pain and dysfunction. She has constant pain, with activity and at rest and significant functional deficits with difficulties even with ADLs. She has had extensive non-op management including analgesics, injections of cortisone and viscosupplements, and home exercise program, but remains in significant pain with significant dysfunction.Radiographs show bone on bone arthritis medial and patellofemoral. She presents now for right Total Knee Arthroplasty.    Procedure in detail---   The patient is brought into the operating room and positioned supine on the operating table. After successful administration of  Adductor canal block and spinal,   a tourniquet is placed high on the  Right thigh(s) and the lower extremity is prepped and draped in the usual sterile fashion. Time out is performed by the operating team and then the  Right lower extremity is wrapped in Esmarch, knee flexed and the tourniquet inflated to 300 mmHg.       A midline incision is made with a ten blade through the subcutaneous tissue to the level of the extensor mechanism. A fresh blade is used to make a medial parapatellar arthrotomy. Soft tissue over the proximal medial tibia is subperiosteally elevated to the joint line with a knife and into the semimembranosus bursa with a Cobb  elevator. Soft tissue over the proximal lateral tibia is elevated with attention being paid to avoiding the patellar tendon on the tibial tubercle. The patella is everted, knee flexed 90 degrees and the ACL and PCL are removed. Findings are bone on bone medial and patellofemoral with large global osteophytes.        The drill is used to create a starting hole in the distal femur and the canal is thoroughly irrigated with sterile saline to remove the fatty contents. The 5 degree Right  valgus alignment guide is placed into the femoral canal and the distal femoral cutting block is pinned to remove 9 mm off the distal femur. Resection is made with an oscillating saw.      The tibia is subluxed forward and the menisci are removed. The extramedullary alignment guide is placed referencing proximally at the medial aspect of the tibial tubercle and distally along the second metatarsal axis and tibial crest. The block is pinned to remove 59mm off the more deficient medial  side. Resection is made with an oscillating saw. Size 4is the most appropriate size for the tibia and the proximal tibia is prepared with the modular drill and keel punch for that size.      The femoral sizing guide is placed and size 5 is most appropriate. Rotation is marked off the epicondylar axis and confirmed by creating a rectangular flexion gap at 90 degrees. The size 5 cutting block is pinned in this rotation and the anterior, posterior and chamfer cuts are made with the oscillating saw. The intercondylar block is then placed and that cut is made.  Trial size 4 tibial component, trial size 5 posterior stabilized femur and a 8  mm posterior stabilized rotating platform insert trial is placed. Full extension is achieved with excellent varus/valgus and anterior/posterior balance throughout full range of motion. The patella is everted and thickness measured to be 21  mm. Free hand resection is taken to 12 mm, a 35 template is placed, lug holes  are drilled, trial patella is placed, and it tracks normally. Osteophytes are removed off the posterior femur with the trial in place. All trials are removed and the cut bone surfaces prepared with pulsatile lavage. Cement is mixed and once ready for implantation, the size 4 tibial implant, size  5 posterior stabilized femoral component, and the size 35 patella are cemented in place and the patella is held with the clamp. The trial insert is placed and the knee held in full extension. The Exparel (20 ml mixed with 60 ml saline) is injected into the extensor mechanism, posterior capsule, medial and lateral gutters and subcutaneous tissues.  All extruded cement is removed and once the cement is hard the permanent 8 mm posterior stabilized rotating platform insert is placed into the tibial tray.      The wound is copiously irrigated with saline solution and the extensor mechanism closed with # 0 Stratofix suture. The tourniquet is released for a total tourniquet time of 27  minutes. Flexion against gravity is 140 degrees and the patella tracks normally. Subcutaneous tissue is closed with 2.0 vicryl and subcuticular with running 4.0 Monocryl. The incision is cleaned and dried and steri-strips and a bulky sterile dressing are applied. The limb is placed into a knee immobilizer and the patient is awakened and transported to recovery in stable condition.      Please note that a surgical assistant was a medical necessity for this procedure in order to perform it in a safe and expeditious manner. Surgical assistant was necessary to retract the ligaments and vital neurovascular structures to prevent injury to them and also necessary for proper positioning of the limb to allow for anatomic placement of the prosthesis.   [name] Lechelle Wrigley, MD    09/14/2020, 9:16 AM

## 2020-09-14 NOTE — Anesthesia Procedure Notes (Signed)
Procedure Name: MAC Date/Time: 09/14/2020 8:14 AM Performed by: Maxwell Caul, CRNA Pre-anesthesia Checklist: Patient identified, Emergency Drugs available, Suction available and Patient being monitored Oxygen Delivery Method: Simple face mask

## 2020-09-14 NOTE — Transfer of Care (Signed)
Immediate Anesthesia Transfer of Care Note  Patient: Tammy Holland  Procedure(s) Performed: TOTAL KNEE ARTHROPLASTY (Right Knee)  Patient Location: PACU  Anesthesia Type:Spinal  Level of Consciousness: awake, alert  and oriented  Airway & Oxygen Therapy: Patient Spontanous Breathing and Patient connected to face mask oxygen  Post-op Assessment: Report given to RN and Post -op Vital signs reviewed and stable  Post vital signs: Reviewed and stable  Last Vitals:  Vitals Value Taken Time  BP 114/66 09/14/20 0938  Temp    Pulse 64 09/14/20 0941  Resp 19 09/14/20 0941  SpO2 100 % 09/14/20 0941  Vitals shown include unvalidated device data.  Last Pain:  Vitals:   09/14/20 0602  TempSrc: Oral         Complications: No complications documented.

## 2020-09-14 NOTE — Care Plan (Signed)
Ortho Bundle Case Management Note  Patient Details  Name: TIMIYAH ROMITO MRN: [id number] Date of Birth: 1941/01/25  R TKA on 09-14-20 DCP:  Home with husband.  1 story home with 0 ste. DME:  RW ordered through [facility].  Has elevated toilets. PT:  [facility].  PT eval scheduled on 09-17-20.                   DME Arranged:  Gilford Rile rolling DME Agency:  [employer]  HH Arranged:  NA [place] Agency:  NA  Additional Comments: Please contact me with any questions of if this plan should need to change.  Marianne Sofia, RN,CCM [facility]  [phone] 09/14/2020, 12:20 PM

## 2020-09-14 NOTE — Interval H&P Note (Signed)
History and Physical Interval Note:  09/14/2020 6:27 AM  Tammy Holland  has presented today for surgery, with the diagnosis of right knee osteoarthritis.  The various methods of treatment have been discussed with the patient and family. After consideration of risks, benefits and other options for treatment, the patient has consented to  Procedure(s) with comments: TOTAL KNEE ARTHROPLASTY (Right) - 19min as a surgical intervention.  The patient's history has been reviewed, patient examined, no change in status, stable for surgery.  I have reviewed the patient's chart and labs.  Questions were answered to the patient's satisfaction.     Pilar Plate Richards Pherigo  History and Physical Interval Note:  09/14/2020 6:27 AM  Tammy Holland  has presented today for surgery, with the diagnosis of right knee osteoarthritis.  The various methods of treatment have been discussed with the patient and family. After consideration of risks, benefits and other options for treatment, the patient has consented to  Procedure(s) with comments: TOTAL KNEE ARTHROPLASTY (Right) - 27min as a surgical intervention.  The patient's history has been reviewed, patient examined, no change in status, stable for surgery.  I have reviewed the patient's chart and labs.  Questions were answered to the patient's satisfaction.     Pilar Plate Alex Mcmanigal

## 2020-09-14 NOTE — Anesthesia Postprocedure Evaluation (Signed)
Anesthesia Post Note  Patient: Tammy Holland  Procedure(s) Performed: TOTAL KNEE ARTHROPLASTY (Right Knee)     Patient location during evaluation: PACU Anesthesia Type: Regional, MAC and Spinal Level of consciousness: oriented and awake and alert Pain management: pain level controlled Vital Signs Assessment: post-procedure vital signs reviewed and stable Respiratory status: spontaneous breathing, respiratory function stable and patient connected to nasal cannula oxygen Cardiovascular status: blood pressure returned to baseline and stable Postop Assessment: no headache, no backache and no apparent nausea or vomiting Anesthetic complications: no   No complications documented.  Last Vitals:  Vitals:   09/14/20 1313 09/14/20 1416  BP: 130/73 133/72  Pulse: 82 84  Resp: 20 20  Temp: 36.6 C 36.4 C  SpO2: 100% 97%    Last Pain:  Vitals:   09/14/20 1522  TempSrc:   PainSc: 6                  Kholton Coate P Zaneta [employer]

## 2020-09-15 ENCOUNTER — Encounter (HOSPITAL_COMMUNITY): Payer: Self-pay | Admitting: Orthopedic Surgery

## 2020-09-15 DIAGNOSIS — Z7951 Long term (current) use of inhaled steroids: Secondary | ICD-10-CM | POA: Diagnosis not present

## 2020-09-15 DIAGNOSIS — Z853 Personal history of malignant neoplasm of breast: Secondary | ICD-10-CM | POA: Diagnosis not present

## 2020-09-15 DIAGNOSIS — Z87891 Personal history of nicotine dependence: Secondary | ICD-10-CM | POA: Diagnosis not present

## 2020-09-15 DIAGNOSIS — M1711 Unilateral primary osteoarthritis, right knee: Secondary | ICD-10-CM | POA: Diagnosis not present

## 2020-09-15 DIAGNOSIS — Z79899 Other long term (current) drug therapy: Secondary | ICD-10-CM | POA: Diagnosis not present

## 2020-09-15 DIAGNOSIS — Z96652 Presence of left artificial knee joint: Secondary | ICD-10-CM | POA: Diagnosis not present

## 2020-09-15 LAB — BASIC METABOLIC PANEL
Anion gap: 7 (ref 5–15)
BUN: 18 mg/dL (ref 8–23)
CO2: 24 mmol/L (ref 22–32)
Calcium: 8.3 mg/dL — ABNORMAL LOW (ref 8.9–10.3)
Chloride: 106 mmol/L (ref 98–111)
Creatinine, Ser: 0.57 mg/dL (ref 0.44–1.00)
GFR, Estimated: >60 mL/min (ref >60)
Glucose, Bld: 111 mg/dL — ABNORMAL HIGH (ref 70–99)
Potassium: 3.8 mmol/L (ref 3.5–5.1)
Sodium: 137 mmol/L (ref 135–145)

## 2020-09-15 LAB — CBC
HCT: 31 % — ABNORMAL LOW (ref 36.0–46.0)
Hemoglobin: 10.3 g/dL — ABNORMAL LOW (ref 12.0–15.0)
MCH: 32 pg (ref 26.0–34.0)
MCHC: 33.2 g/dL (ref 30.0–36.0)
MCV: 96.3 fL (ref 80.0–100.0)
Platelets: 176 10*3/uL (ref 150–400)
RBC: 3.22 MIL/uL — ABNORMAL LOW (ref 3.87–5.11)
RDW: 13.4 % (ref 11.5–15.5)
WBC: 12.7 10*3/uL — ABNORMAL HIGH (ref 4.0–10.5)
nRBC: 0 % (ref 0.0–0.2)

## 2020-09-15 MED ORDER — OXYCODONE HCL 5 MG PO TABS: 5.0000 mg | ORAL_TABLET | Freq: Four times a day (QID) | ORAL | 0 refills | Status: DC | PRN

## 2020-09-15 MED ORDER — RIVAROXABAN 10 MG PO TABS: 10.0000 mg | ORAL_TABLET | Freq: Every day | ORAL | 0 refills | Status: DC

## 2020-09-15 MED ORDER — METHOCARBAMOL 500 MG PO TABS: 500.0000 mg | ORAL_TABLET | Freq: Four times a day (QID) | ORAL | 0 refills | Status: DC | PRN

## 2020-09-15 NOTE — Progress Notes (Signed)
Physical Therapy Treatment Patient Details Name: Tammy Holland MRN: [id number] DOB: October 05, 1940 Today's Date: 09/15/2020    History of Present Illness Patient is a 80 y.o. female s/p Rt TKA on 09/14/2020 with PMH significant for HTN, HLD, hearing loss, dyspnea, breast cancer, CAD, asthma, OA, and Lt TKA (2017).    PT Comments    Pt assisted with ambulating in hallway however pain increased with mobility so returned to bed.  Will return to review HEP prior to d/c.   Follow Up Recommendations  Follow surgeons recommendation for DC plan and follow-up therapies;Outpatient PT     Equipment Recommendations  Rolling walker with 5" wheels    Recommendations for Other Services       Precautions / Restrictions Precautions Precautions: Fall;Knee Restrictions Other Position/Activity Restrictions: WBAT    Mobility  Bed Mobility Overal bed mobility: Needs Assistance Bed Mobility: Supine to Sit;Sit to Supine     Supine to sit: Supervision Sit to supine: Supervision   General bed mobility comments: pt self assists Rt LE with UEs    Transfers Overall transfer level: Needs assistance Equipment used: Rolling walker (2 wheeled) Transfers: Sit to/from Stand Sit to Stand: Min guard         General transfer comment: verbal cues for UE and LE positioning  Ambulation/Gait Ambulation/Gait assistance: Min guard Gait Distance (Feet): 80 Feet Assistive device: Rolling walker (2 wheeled) Gait Pattern/deviations: Step-to pattern;Narrow base of support;Decreased stance time - right;Antalgic Gait velocity: decr   General Gait Details: verbal cues for sequence, neutral alignment (tends to perform toe in)   Stairs             Wheelchair Mobility    Modified Rankin (Stroke Patients Only)       Balance                                            Cognition Arousal/Alertness: Awake/alert Behavior During Therapy: WFL for tasks assessed/performed Overall  Cognitive Status: Within Functional Limits for tasks assessed                                        Exercises      General Comments        Pertinent Vitals/Pain Pain Assessment: 0-10 Pain Score: 7  Pain Location: Rt knee Pain Descriptors / Indicators: Aching;Sore Pain Intervention(s): Repositioned;Ice applied;Monitored during session Secretary/administrator to give pain meds)    Home Living                      Prior Function            PT Goals (current goals can now be found in the care plan section) Progress towards PT goals: Progressing toward goals    Frequency    7X/week      PT Plan Current plan remains appropriate    Co-evaluation              AM-PAC PT "6 Clicks" Mobility   Outcome Measure  Help needed turning from your back to your side while in a flat bed without using bedrails?: None Help needed moving from lying on your back to sitting on the side of a flat bed without using bedrails?: A Little Help needed moving to and  from a bed to a chair (including a wheelchair)?: A Little Help needed standing up from a chair using your arms (e.g., wheelchair or bedside chair)?: A Little Help needed to walk in hospital room?: A Little Help needed climbing 3-5 steps with a railing? : A Little 6 Click Score: 19    End of Session Equipment Utilized During Treatment: Gait belt Activity Tolerance: Patient tolerated treatment well Patient left: with call bell/phone within reach;in bed;with nursing/sitter in room   PT Visit Diagnosis: Muscle weakness (generalized) (M62.81);Other abnormalities of gait and mobility (R26.89)     Time: 1137-1150 PT Time Calculation (min) (ACUTE ONLY): 13 min  Charges:  $Gait Training: 8-22 mins                    Tammy Holland PT, DPT Acute Rehabilitation Services Pager: [phone] Office: [phone]  Trena Platt 09/15/2020, 2:47 PM

## 2020-09-15 NOTE — Progress Notes (Signed)
Patient discharged to home w/ husband. Given all belongings, instructions. Verbalized understanding of instructions. Escorted to pov via w/c.

## 2020-09-15 NOTE — Progress Notes (Signed)
I have reviewed and concur with students documentation.  

## 2020-09-15 NOTE — Progress Notes (Signed)
Subjective: 1 Day Post-Op Procedure(s) (LRB): TOTAL KNEE ARTHROPLASTY (Right) Patient reports pain as mild.   Patient seen in rounds by Dr. Wynelle Link. Patient is well, and has had no acute complaints or problems. No acute overnight events. Denies SOB, chest pain, or calf pain. Foley pulled this am. Ambulated 30 feet with PT yesterday.  We will continue therapy today.   Objective: Vital signs in last 24 hours: Temp:  [97.5 F (36.4 C)-98.1 F (36.7 C)] 97.9 F (36.6 C) (02/22 0634) Pulse Rate:  [52-84] 69 (02/22 0634) Resp:  [11-29] 16 (02/22 0634) BP: (110-133)/(52-78) 120/70 (02/22 0634) SpO2:  [95 %-100 %] 96 % (02/22 0813) Weight:  [59.4 kg] 59.4 kg (02/21 1430)  Intake/Output from previous day:  Intake/Output Summary (Last 24 hours) at 09/15/2020 0923 Last data filed at 09/15/2020 0640 Gross per 24 hour  Intake 2839.82 ml  Output 4675 ml  Net -1835.18 ml     Intake/Output this shift: No intake/output data recorded.  Labs: Recent Labs    09/15/20 0325  HGB 10.3*   Recent Labs    09/15/20 0325  WBC 12.7*  RBC 3.22*  HCT 31.0*  PLT 176   Recent Labs    09/15/20 0325  NA 137  K 3.8  CL 106  CO2 24  BUN 18  CREATININE 0.57  GLUCOSE 111*  CALCIUM 8.3*   No results for input(s): LABPT, INR in the last 72 hours.  Exam: General - Patient is Alert, Appropriate and Oriented Extremity - Neurologically intact Neurovascular intact Sensation intact distally Intact pulses distally Dorsiflexion/Plantar flexion intact Dressing - dressing C/D/I Motor Function - intact, moving foot and toes well on exam.   Past Medical History:  Diagnosis Date   Arthritis    oa   Asthma    Breast cancer ([place])    CAD (coronary artery disease), native coronary artery    coronary CTA 2021 with 25-49% mLAD stenosis and normal FFR   Cancer ([place]) 2009   BREAST left partial mastectomy and radiation   Clinically significant macular edema with cotton wool spots    takes  preservision for   Cold within last month   spitting up clear mucous now, no fevers   Dyspnea    with exertion only   Hearing loss    both ears   Hemorrhoids    HLD (hyperlipidemia)    LDL goal < 70   Hypertension    Personal history of radiation therapy     Assessment/Plan: 1 Day Post-Op Procedure(s) (LRB): TOTAL KNEE ARTHROPLASTY (Right) Active Problems:   OA (osteoarthritis) of knee  Estimated body mass index is 22.48 kg/m as calculated from the following:   Height as of this encounter: 5\' 4"  (1.626 m).   Weight as of this encounter: 59.4 kg. Advance diet Up with therapy   Patient's anticipated LOS is less than 2 midnights, meeting these requirements: - Lives within 1 hour of care - Has a competent adult at home to recover with post-op recover - NO history of  - Chronic pain requiring opiods  - Diabetes  - Coronary Artery Disease  - Heart failure  - Heart attack  - Stroke  - DVT/VTE  - Cardiac arrhythmia  - Respiratory Failure/COPD  - Renal failure  - Anemia  - Advanced Liver disease   DVT Prophylaxis - Xarelto Weight bearing as tolerated. Two sessions of physical therapy today, and if meeting goals, will plan for discharge today.   Plan is to go Home  after hospital stay.  The PDMP database was reviewed today prior to any opioid medications being prescribed to this patient.  Patient to follow up with Dr. Wynelle Link in clinic in two weeks.   Fenton Foy, MBA, PA-C Orthopedic Surgery 09/15/2020, 9:23 AM

## 2020-09-15 NOTE — TOC Progression Note (Signed)
Transition of Care [facility]) - Progression Note    Patient Details  Name: Tammy Holland MRN: [id number] Date of Birth: 28-Dec-1940  [facility]) CM/SW Contact  Joaquin Courts, RN Phone Number: 09/15/2020, 9:55 AM  Clinical Narrative:    Plant to dc with OPPT, [employer] to provide rolling walker.   Expected Discharge Plan: Home/Self Care Barriers to Discharge: No Barriers Identified  Expected Discharge Plan and Services Expected Discharge Plan: Home/Self Care   Discharge Planning Services: CM Consult   Living arrangements for the past 2 months: Single Family Home Expected Discharge Date: 09/15/20               DME Arranged: Gilford Rile rolling DME Agency: [employer]       HH Arranged: NA HH Agency: NA         Social Determinants of Health (SDOH) Interventions    Readmission Risk Interventions No flowsheet data found.

## 2020-09-15 NOTE — Progress Notes (Signed)
Physical Therapy Treatment Patient Details Name: Tammy Holland MRN: [id number] DOB: [birth year] Today's Date: 09/15/2020    History of Present Illness Patient is a 80 y.o. female s/p Rt TKA on 09/14/2020 with PMH significant for HTN, HLD, hearing loss, dyspnea, breast cancer, CAD, asthma, OA, and Lt TKA (2017).    PT Comments    Pt performed LE exercises and then ambulated in hallway.  Pt provided with HEP handout.  Pt had no further questions and feels ready for d/c home today.    Follow Up Recommendations  Follow surgeon's recommendation for DC plan and follow-up therapies;Outpatient PT     Equipment Recommendations  Rolling walker with 5" wheels    Recommendations for Other Services       Precautions / Restrictions Precautions Precautions: Fall;Knee Restrictions Other Position/Activity Restrictions: WBAT    Mobility  Bed Mobility Overal bed mobility: Needs Assistance Bed Mobility: Supine to Sit;Sit to Supine     Supine to sit: Supervision Sit to supine: Supervision   General bed mobility comments: pt self assists Rt LE with UEs    Transfers Overall transfer level: Needs assistance Equipment used: Rolling walker (2 wheeled) Transfers: Sit to/from Stand Sit to Stand: Min guard;Supervision         General transfer comment: verbal cues for UE and LE positioning  Ambulation/Gait Ambulation/Gait assistance: Min guard;Supervision Gait Distance (Feet): 80 Feet Assistive device: Rolling walker (2 wheeled) Gait Pattern/deviations: Step-to pattern;Narrow base of support;Decreased stance time - right;Antalgic Gait velocity: decr   General Gait Details: verbal cues for sequence, neutral alignment (tends to perform toe in)   Stairs             Wheelchair Mobility    Modified Rankin (Stroke Patients Only)       Balance                                            Cognition Arousal/Alertness: Awake/alert Behavior During Therapy:  WFL for tasks assessed/performed Overall Cognitive Status: Within Functional Limits for tasks assessed                                        Exercises Total Joint Exercises Ankle Circles/Pumps: AROM;Both;10 reps Quad Sets: AROM;10 reps;Both Short Arc Quad: AROM;Right;10 reps Heel Slides: 10 reps;AAROM;Right Hip ABduction/ADduction: AAROM;Right;10 reps Straight Leg Raises: AAROM;Right;10 reps Knee Flexion: AAROM;Seated;Right;10 reps    General Comments        Pertinent Vitals/Pain Pain Assessment: 0-10 Pain Score: 4  Pain Location: Rt knee Pain Descriptors / Indicators: Aching;Sore Pain Intervention(s): Monitored during session;Repositioned;Ice applied    Home Living                      Prior Function            PT Goals (current goals can now be found in the care plan section) Progress towards PT goals: Progressing toward goals    Frequency    7X/week      PT Plan Current plan remains appropriate    Co-evaluation              AM-PAC PT "6 Clicks" Mobility   Outcome Measure  Help needed turning from your back to your side while in a flat bed without using bedrails?:  None Help needed moving from lying on your back to sitting on the side of a flat bed without using bedrails?: None Help needed moving to and from a bed to a chair (including a wheelchair)?: A Little Help needed standing up from a chair using your arms (e.g., wheelchair or bedside chair)?: A Little Help needed to walk in hospital room?: A Little Help needed climbing 3-5 steps with a railing? : A Little 6 Click Score: 20    End of Session Equipment Utilized During Treatment: Gait belt Activity Tolerance: Patient tolerated treatment well Patient left: in bed;with call bell/phone within reach   PT Visit Diagnosis: Muscle weakness (generalized) (M62.81);Other abnormalities of gait and mobility (R26.89)     Time: 1340-1403 PT Time Calculation (min) (ACUTE ONLY):  23 min  Charges:  $Gait Training: 8-22 mins $Therapeutic Exercise: 8-22 mins                     Jannette Spanner PT, DPT Acute Rehabilitation Services Pager: [phone] Office: [phone]  York Ram E 09/15/2020, 2:54 PM

## 2020-09-17 DIAGNOSIS — M25561 Pain in right knee: Secondary | ICD-10-CM | POA: Diagnosis not present

## 2020-09-21 DIAGNOSIS — M25561 Pain in right knee: Secondary | ICD-10-CM | POA: Diagnosis not present

## 2020-09-22 ENCOUNTER — Ambulatory Visit: Payer: Medicare Other | Admitting: Physical Therapy

## 2020-09-23 DIAGNOSIS — M25561 Pain in right knee: Secondary | ICD-10-CM | POA: Diagnosis not present

## 2020-09-25 DIAGNOSIS — M25561 Pain in right knee: Secondary | ICD-10-CM | POA: Diagnosis not present

## 2020-09-28 DIAGNOSIS — M25561 Pain in right knee: Secondary | ICD-10-CM | POA: Diagnosis not present

## 2020-09-29 ENCOUNTER — Encounter: Payer: Medicare Other | Admitting: Physical Therapy

## 2020-09-30 DIAGNOSIS — M25561 Pain in right knee: Secondary | ICD-10-CM | POA: Diagnosis not present

## 2020-10-02 DIAGNOSIS — M25561 Pain in right knee: Secondary | ICD-10-CM | POA: Diagnosis not present

## 2020-10-05 DIAGNOSIS — M25561 Pain in right knee: Secondary | ICD-10-CM | POA: Diagnosis not present

## 2020-10-06 ENCOUNTER — Encounter: Payer: Medicare Other | Admitting: Physical Therapy

## 2020-10-08 DIAGNOSIS — M25561 Pain in right knee: Secondary | ICD-10-CM | POA: Diagnosis not present

## 2020-10-13 ENCOUNTER — Encounter: Payer: Medicare Other | Admitting: Physical Therapy

## 2020-10-13 DIAGNOSIS — H903 Sensorineural hearing loss, bilateral: Secondary | ICD-10-CM | POA: Diagnosis not present

## 2020-10-13 DIAGNOSIS — Z974 Presence of external hearing-aid: Secondary | ICD-10-CM | POA: Diagnosis not present

## 2020-10-13 DIAGNOSIS — M25561 Pain in right knee: Secondary | ICD-10-CM | POA: Diagnosis not present

## 2020-10-15 DIAGNOSIS — M25561 Pain in right knee: Secondary | ICD-10-CM | POA: Diagnosis not present

## 2020-10-19 DIAGNOSIS — M47812 Spondylosis without myelopathy or radiculopathy, cervical region: Secondary | ICD-10-CM | POA: Diagnosis not present

## 2020-10-19 DIAGNOSIS — I2510 Atherosclerotic heart disease of native coronary artery without angina pectoris: Secondary | ICD-10-CM | POA: Diagnosis not present

## 2020-10-19 DIAGNOSIS — M19049 Primary osteoarthritis, unspecified hand: Secondary | ICD-10-CM | POA: Diagnosis not present

## 2020-10-19 DIAGNOSIS — J45909 Unspecified asthma, uncomplicated: Secondary | ICD-10-CM | POA: Diagnosis not present

## 2020-10-19 DIAGNOSIS — Z853 Personal history of malignant neoplasm of breast: Secondary | ICD-10-CM | POA: Diagnosis not present

## 2020-10-19 DIAGNOSIS — Z800 Family history of malignant neoplasm of digestive organs: Secondary | ICD-10-CM | POA: Diagnosis not present

## 2020-10-19 DIAGNOSIS — I10 Essential (primary) hypertension: Secondary | ICD-10-CM | POA: Diagnosis not present

## 2020-10-19 DIAGNOSIS — Z0000 Encounter for general adult medical examination without abnormal findings: Secondary | ICD-10-CM | POA: Diagnosis not present

## 2020-10-19 DIAGNOSIS — Z1389 Encounter for screening for other disorder: Secondary | ICD-10-CM | POA: Diagnosis not present

## 2020-10-19 DIAGNOSIS — K5900 Constipation, unspecified: Secondary | ICD-10-CM | POA: Diagnosis not present

## 2020-10-19 DIAGNOSIS — H9191 Unspecified hearing loss, right ear: Secondary | ICD-10-CM | POA: Diagnosis not present

## 2020-10-19 DIAGNOSIS — J301 Allergic rhinitis due to pollen: Secondary | ICD-10-CM | POA: Diagnosis not present

## 2020-10-19 DIAGNOSIS — Z7189 Other specified counseling: Secondary | ICD-10-CM | POA: Diagnosis not present

## 2020-10-20 ENCOUNTER — Encounter: Payer: Medicare Other | Admitting: Physical Therapy

## 2020-10-20 DIAGNOSIS — Z471 Aftercare following joint replacement surgery: Secondary | ICD-10-CM | POA: Diagnosis not present

## 2020-10-20 DIAGNOSIS — Z96651 Presence of right artificial knee joint: Secondary | ICD-10-CM | POA: Diagnosis not present

## 2020-10-21 DIAGNOSIS — M25561 Pain in right knee: Secondary | ICD-10-CM | POA: Diagnosis not present

## 2020-10-23 DIAGNOSIS — M25561 Pain in right knee: Secondary | ICD-10-CM | POA: Diagnosis not present

## 2020-10-27 DIAGNOSIS — M25561 Pain in right knee: Secondary | ICD-10-CM | POA: Diagnosis not present

## 2020-10-29 DIAGNOSIS — M25561 Pain in right knee: Secondary | ICD-10-CM | POA: Diagnosis not present

## 2020-12-08 ENCOUNTER — Other Ambulatory Visit: Payer: Self-pay | Admitting: Cardiology

## 2020-12-29 DIAGNOSIS — H35363 Drusen (degenerative) of macula, bilateral: Secondary | ICD-10-CM | POA: Diagnosis not present

## 2021-01-01 ENCOUNTER — Ambulatory Visit: Payer: Medicare Other | Attending: Internal Medicine

## 2021-01-01 ENCOUNTER — Other Ambulatory Visit: Payer: Self-pay

## 2021-01-01 ENCOUNTER — Other Ambulatory Visit (HOSPITAL_BASED_OUTPATIENT_CLINIC_OR_DEPARTMENT_OTHER): Payer: Self-pay

## 2021-01-01 DIAGNOSIS — Z23 Encounter for immunization: Secondary | ICD-10-CM

## 2021-01-01 MED ORDER — COVID-19 MRNA VACC (MODERNA) 100 MCG/0.5ML IM SUSP
INTRAMUSCULAR | 0 refills | Status: DC
  Filled 2021-01-01: qty 0.25, 1d supply, fill #0

## 2021-01-01 NOTE — Progress Notes (Signed)
   Covid-19 Vaccination Clinic  Name:  Tammy Holland    MRN: [id number] DOB: 1941-06-25  01/01/2021  [name] was observed post Covid-19 immunization for 15 minutes without incident. She was provided with Vaccine Information Sheet and instruction to access the V-Safe system.   [name] was instructed to call 911 with any severe reactions post vaccine: Difficulty breathing  Swelling of face and throat  A fast heartbeat  A bad rash all over body  Dizziness and weakness   Immunizations Administered     Name Date Dose VIS Date Route   Moderna Covid-19 Booster Vaccine 01/01/2021 12:17 PM 0.25 mL 05/13/2020 Intramuscular   Manufacturer: Moderna   Lot: [id number]   [place]: 02542-706-23

## 2021-02-23 ENCOUNTER — Other Ambulatory Visit: Payer: Self-pay | Admitting: Internal Medicine

## 2021-02-23 DIAGNOSIS — Z1231 Encounter for screening mammogram for malignant neoplasm of breast: Secondary | ICD-10-CM

## 2021-03-04 DIAGNOSIS — Z96651 Presence of right artificial knee joint: Secondary | ICD-10-CM | POA: Diagnosis not present

## 2021-03-10 DIAGNOSIS — L821 Other seborrheic keratosis: Secondary | ICD-10-CM | POA: Diagnosis not present

## 2021-03-10 DIAGNOSIS — L814 Other melanin hyperpigmentation: Secondary | ICD-10-CM | POA: Diagnosis not present

## 2021-03-10 DIAGNOSIS — L818 Other specified disorders of pigmentation: Secondary | ICD-10-CM | POA: Diagnosis not present

## 2021-03-10 DIAGNOSIS — D1801 Hemangioma of skin and subcutaneous tissue: Secondary | ICD-10-CM | POA: Diagnosis not present

## 2021-04-16 ENCOUNTER — Ambulatory Visit: Payer: Medicare Other

## 2021-04-29 ENCOUNTER — Ambulatory Visit
Admission: RE | Admit: 2021-04-29 | Discharge: 2021-04-29 | Disposition: A | Discharge status: Home / Self Care | Payer: Medicare Other | Source: Ambulatory Visit | Attending: Internal Medicine | Admitting: Internal Medicine

## 2021-04-29 ENCOUNTER — Other Ambulatory Visit: Payer: Self-pay

## 2021-04-29 DIAGNOSIS — Z1231 Encounter for screening mammogram for malignant neoplasm of breast: Secondary | ICD-10-CM

## 2021-05-17 DIAGNOSIS — I10 Essential (primary) hypertension: Secondary | ICD-10-CM | POA: Diagnosis not present

## 2021-05-17 DIAGNOSIS — Z853 Personal history of malignant neoplasm of breast: Secondary | ICD-10-CM | POA: Diagnosis not present

## 2021-05-17 DIAGNOSIS — J45909 Unspecified asthma, uncomplicated: Secondary | ICD-10-CM | POA: Diagnosis not present

## 2021-06-25 ENCOUNTER — Other Ambulatory Visit: Payer: Self-pay

## 2021-06-25 ENCOUNTER — Other Ambulatory Visit (HOSPITAL_BASED_OUTPATIENT_CLINIC_OR_DEPARTMENT_OTHER): Payer: Self-pay

## 2021-06-25 ENCOUNTER — Ambulatory Visit: Payer: Medicare Other | Attending: Internal Medicine

## 2021-06-25 DIAGNOSIS — Z23 Encounter for immunization: Secondary | ICD-10-CM

## 2021-06-25 MED ORDER — MODERNA COVID-19 BIVAL BOOSTER 50 MCG/0.5ML IM SUSP
INTRAMUSCULAR | 0 refills | Status: DC
  Filled 2021-06-25: qty 0.5, 1d supply, fill #0

## 2021-06-25 NOTE — Progress Notes (Signed)
   Covid-19 Vaccination Clinic  Name:  Tammy Holland    MRN: [id number] DOB: 08-Jul-1941  06/25/2021  Ms. Tammy Holland was observed post Covid-19 immunization for 15 minutes without incident. She was provided with Vaccine Information Sheet and instruction to access the V-Safe system.   Ms. Tammy Holland was instructed to call 911 with any severe reactions post vaccine: Difficulty breathing  Swelling of face and throat  A fast heartbeat  A bad rash all over body  Dizziness and weakness   Immunizations Administered     Name Date Dose VIS Date Route   Moderna Covid-19 vaccine Bivalent Booster 06/25/2021  2:51 PM 0.5 mL 03/06/2021 Intramuscular   Manufacturer: Levan Hurst   Lot: [id number]   [place]: [id number]

## 2021-08-31 ENCOUNTER — Other Ambulatory Visit: Payer: Self-pay | Admitting: Cardiology

## 2021-09-24 ENCOUNTER — Other Ambulatory Visit: Payer: Self-pay | Admitting: Cardiology

## 2021-09-30 DIAGNOSIS — Z96653 Presence of artificial knee joint, bilateral: Secondary | ICD-10-CM | POA: Diagnosis not present

## 2021-09-30 DIAGNOSIS — Z96651 Presence of right artificial knee joint: Secondary | ICD-10-CM | POA: Diagnosis not present

## 2021-10-22 DIAGNOSIS — M19049 Primary osteoarthritis, unspecified hand: Secondary | ICD-10-CM | POA: Diagnosis not present

## 2021-10-22 DIAGNOSIS — Z853 Personal history of malignant neoplasm of breast: Secondary | ICD-10-CM | POA: Diagnosis not present

## 2021-10-22 DIAGNOSIS — J45909 Unspecified asthma, uncomplicated: Secondary | ICD-10-CM | POA: Diagnosis not present

## 2021-10-22 DIAGNOSIS — Z1389 Encounter for screening for other disorder: Secondary | ICD-10-CM | POA: Diagnosis not present

## 2021-10-22 DIAGNOSIS — Z800 Family history of malignant neoplasm of digestive organs: Secondary | ICD-10-CM | POA: Diagnosis not present

## 2021-10-22 DIAGNOSIS — Z23 Encounter for immunization: Secondary | ICD-10-CM | POA: Diagnosis not present

## 2021-10-22 DIAGNOSIS — I10 Essential (primary) hypertension: Secondary | ICD-10-CM | POA: Diagnosis not present

## 2021-10-22 DIAGNOSIS — I2510 Atherosclerotic heart disease of native coronary artery without angina pectoris: Secondary | ICD-10-CM | POA: Diagnosis not present

## 2021-10-22 DIAGNOSIS — R1032 Left lower quadrant pain: Secondary | ICD-10-CM | POA: Diagnosis not present

## 2021-10-22 DIAGNOSIS — J301 Allergic rhinitis due to pollen: Secondary | ICD-10-CM | POA: Diagnosis not present

## 2021-10-22 DIAGNOSIS — M47812 Spondylosis without myelopathy or radiculopathy, cervical region: Secondary | ICD-10-CM | POA: Diagnosis not present

## 2021-10-22 DIAGNOSIS — H9191 Unspecified hearing loss, right ear: Secondary | ICD-10-CM | POA: Diagnosis not present

## 2021-10-22 DIAGNOSIS — R1031 Right lower quadrant pain: Secondary | ICD-10-CM | POA: Diagnosis not present

## 2021-10-22 DIAGNOSIS — Z0000 Encounter for general adult medical examination without abnormal findings: Secondary | ICD-10-CM | POA: Diagnosis not present

## 2021-11-23 DIAGNOSIS — J4521 Mild intermittent asthma with (acute) exacerbation: Secondary | ICD-10-CM | POA: Diagnosis not present

## 2021-11-23 DIAGNOSIS — J301 Allergic rhinitis due to pollen: Secondary | ICD-10-CM | POA: Diagnosis not present

## 2022-01-19 ENCOUNTER — Ambulatory Visit (INDEPENDENT_AMBULATORY_CARE_PROVIDER_SITE_OTHER): Payer: Medicare Other | Admitting: Cardiology

## 2022-01-19 ENCOUNTER — Encounter: Payer: Self-pay | Admitting: Cardiology

## 2022-01-19 VITALS — BP 118/74 | HR 64 | Ht 64.0 in | Wt 129.8 lb

## 2022-01-19 DIAGNOSIS — I2510 Atherosclerotic heart disease of native coronary artery without angina pectoris: Secondary | ICD-10-CM

## 2022-01-19 DIAGNOSIS — R002 Palpitations: Secondary | ICD-10-CM | POA: Diagnosis not present

## 2022-01-19 DIAGNOSIS — E7800 Pure hypercholesterolemia, unspecified: Secondary | ICD-10-CM | POA: Diagnosis not present

## 2022-01-19 DIAGNOSIS — I10 Essential (primary) hypertension: Secondary | ICD-10-CM | POA: Diagnosis not present

## 2022-01-19 MED ORDER — ATORVASTATIN CALCIUM 40 MG PO TABS
40.0000 mg | ORAL_TABLET | Freq: Every day | ORAL | 3 refills | Status: AC
Start: 2022-01-19 — End: ?

## 2022-01-19 NOTE — Progress Notes (Signed)
Cardiology Cardiology Note    Date:  01/19/2022   ID:  Tammy Holland, DOB [birth year], MRN [id number]  PCP:  Leeroy Cha, MD  Cardiologist:  Fransico Him, MD   Chief Complaint  Patient presents with   Coronary Artery Disease   Hypertension   Hyperlipidemia    History of Present Illness:  Tammy Holland is a 81 y.o. female with a hx of asthma, breast CA, and HTN.  She has a remote hx of tobacco use and has a family hx of CAD in her brother who had a CABG.  She was referred to Cardiology for evaluation of atypical chest pain that was felt in the past to be due to GERD.  Coronary CTA was done due to persistence of CP and showed 25-49% mLAD non obstructive disease with normal FFR.  She was started on statin for HLD with LDL goal < 70 and started on ASA.    She is here today for followup and is doing well.  She occasionally has some DOE when in her exercise classes but this is not new and is unchanged since I saw her last.  She does get dizzy if she reaches her arms over her head during exercise class. She denies any chest pain or pressure, PND, orthopnea, LE edema or syncope. She has noticed some fluttering in her chest but is very rare in occurrence lasting 5 minutes at a time. She is compliant with her meds and is tolerating meds with no SE.     Past Medical History:  Diagnosis Date   Arthritis    oa   Asthma    Breast cancer ([place])    CAD (coronary artery disease), native coronary artery    coronary CTA 2021 with 25-49% mLAD stenosis and normal FFR   Cancer ([place]) 2009   BREAST left partial mastectomy and radiation   Clinically significant macular edema with cotton wool spots    takes preservision for   Cold within last month   spitting up clear mucous now, no fevers   Dyspnea    with exertion only   Hearing loss    both ears   Hemorrhoids    HLD (hyperlipidemia)    LDL goal < 70   Hypertension    Personal history of radiation therapy     Past Surgical  History:  Procedure Laterality Date   BREAST LUMPECTOMY     colonscopy     TONSILLECTOMY AND ADENOIDECTOMY  age 57   TOTAL KNEE ARTHROPLASTY Left 07/04/2016   Procedure: LEFT TOTAL KNEE ARTHROPLASTY;  Surgeon: Gaynelle Arabian, MD;  Location: [facility];  Service: Orthopedics;  Laterality: Left;   TOTAL KNEE ARTHROPLASTY Right 09/14/2020   Procedure: TOTAL KNEE ARTHROPLASTY;  Surgeon: Gaynelle Arabian, MD;  Location: [facility];  Service: Orthopedics;  Laterality: Right;  32mn   varicose vein saline injections      Current Medications: Current Meds  Medication Sig   albuterol (PROVENTIL HFA;VENTOLIN HFA) 108 (90 Base) MCG/ACT inhaler Inhale 2 puffs into the lungs every 4 (four) hours as needed for wheezing or shortness of breath.   Artificial Tear Solution (SOOTHE XP) SOLN Place 1 drop into both eyes daily as needed (dry eyes).   atorvastatin (LIPITOR) 20 MG tablet Take 1 tablet (20 mg total) by mouth daily. Please keep upcoming appt in June 2023 with Dr. TRadford Paxbefore anymore refills. Thank you   bisacodyl (DULCOLAX) 5 MG EC tablet Take 5 mg by mouth daily as needed for  moderate constipation.   budesonide-formoterol (SYMBICORT) 80-4.5 MCG/ACT inhaler Inhale 2 puffs into the lungs 2 (two) times daily.   COVID-19 mRNA bivalent vaccine, Moderna, (MODERNA COVID-19 BIVAL BOOSTER) 50 MCG/0.5ML injection Inject into the muscle.   COVID-19 mRNA vaccine, Moderna, 100 MCG/0.5ML injection Inject into the muscle.   fexofenadine (ALLEGRA) 180 MG tablet Take 180 mg by mouth daily.   fluticasone (FLONASE) 50 MCG/ACT nasal spray Place 1-2 sprays into both nostrils daily as needed for allergies.   hydrochlorothiazide (MICROZIDE) 12.5 MG capsule Take 12.5 mg by mouth daily.    ketotifen (ZADITOR) 0.025 % ophthalmic solution Place 1 drop into both eyes 2 (two) times daily as needed (for allergy eyes).   Misc Natural Products (OSTEO BI-FLEX TRIPLE STRENGTH) TABS Take 2 tablets by mouth daily.   Multiple Vitamin  (MULTIVITAMIN) capsule Take 1 capsule by mouth daily.   Omega-3 Fatty Acids (FISH OIL) 1200 MG CAPS Take 1 capsule by mouth daily.   OVER THE COUNTER MEDICATION Biotin and collagen liquid   oxymetazoline (AFRIN) 0.05 % nasal spray Place 1 spray into both nostrils 2 (two) times daily as needed for congestion.   polyethylene glycol (MIRALAX / GLYCOLAX) 17 g packet Take 17 g by mouth daily as needed for moderate constipation.   sodium chloride (OCEAN) 0.65 % SOLN nasal spray Place 1-2 sprays into both nostrils 3 (three) times daily as needed for congestion.    Allergies:   Patient has no known allergies.   Social History   Socioeconomic History   Marital status: Married    Spouse name: Not on file   Number of children: Not on file   Years of education: Not on file   Highest education level: Not on file  Occupational History   Not on file  Tobacco Use   Smoking status: Former    Years: 30.00    Types: Cigarettes    Quit date: 07/25/1976    Years since quitting: 45.5   Smokeless tobacco: Never   Tobacco comments:    social smoking only  Vaping Use   Vaping Use: Never used  Substance and Sexual Activity   Alcohol use: Yes    Alcohol/week: 2.0 standard drinks of alcohol    Types: 2 Glasses of wine per week    Comment: per day.   Drug use: No   Sexual activity: Not on file  Other Topics Concern   Not on file  Social History Narrative   Not on file   Social Determinants of Health   Financial Resource Strain: Not on file  Food Insecurity: Not on file  Transportation Needs: Not on file  Physical Activity: Not on file  Stress: Not on file  Social Connections: Not on file     Family History:  The patient's family history includes Alcohol abuse in her brother; Breast cancer in her cousin; Cancer in her father; Colon cancer in her sister; Coronary artery disease in her brother; Depression in her brother; Emphysema in her mother; Hepatitis in her father; Hypertension in her sister;  Stroke in her maternal grandmother.   ROS:   Please see the history of present illness.    ROS All other systems reviewed and are negative.      No data to display             PHYSICAL EXAM:   VS:  BP 118/74   Pulse 64   Ht '5\' 4"'$  (1.626 m)   Wt 129 lb 12.8 oz (58.9 kg)  SpO2 99%   BMI 22.28 kg/m    GEN: Well nourished, well developed in no acute distress HEENT: Normal NECK: No JVD; No carotid bruits LYMPHATICS: No lymphadenopathy CARDIAC:RRR, no murmurs, rubs, gallops RESPIRATORY:  Clear to auscultation without rales, wheezing or rhonchi  ABDOMEN: Soft, non-tender, non-distended MUSCULOSKELETAL:  No edema; No deformity  SKIN: Warm and dry NEUROLOGIC:  Alert and oriented x 3 PSYCHIATRIC:  Normal affect   Wt Readings from Last 3 Encounters:  01/19/22 129 lb 12.8 oz (58.9 kg)  09/14/20 130 lb 15.3 oz (59.4 kg)  09/03/20 131 lb (59.4 kg)      Studies/Labs Reviewed:   EKG:  EKG is ordered today.  The ekg ordered today demonstrates NSR with PAC an nonspecific T wave abnormality  Coronary CTA 11/2019   Aorta: Normal size. Scattered calcifications in the ascending and descending thoracic aorta. No dissection.   Aortic Valve:  Trileaflet.  No calcifications.   Coronary Arteries:  Normal coronary origin.  Right dominance.   RCA is a large dominant artery that gives rise to PDA and PLVB. There is no plaque.   Left main is a large artery that gives rise to LAD and LCX arteries. There is no plaque.   LAD is a large vessel that gives rise to 2 large branching D1 and D2 arteries. There is mild calcified plaque in the mid LAD with associated stenosis of 25-49%.   LCX is a non-dominant artery that gives rise to one small OM1 branch. There is no plaque in the proximal and mid LCx. The distal LCx is poorly visualized.   Other findings:   Normal pulmonary vein drainage into the left atrium.   Normal let atrial appendage without a thrombus.   Normal size of the  pulmonary artery.   Small PFO is present.   IMPRESSION: 1. Coronary calcium score of 18. This was 32nd percentile for age and sex matched control.   2.  Normal coronary origin with right dominance.   3.  Mild atherosclerosis of the mid LAD.  CAD-RADS 2.   4.  Consider non-atherosclerotic causes of chest pain.   5.  Recommend preventive therapy and risk factor modification.   6.  This study has been submitted for FFR analysis.   Ferlin Fairhurst    Recent Labs: No results found for requested labs within last 365 days.   Lipid Panel    Component Value Date/Time   CHOL 152 02/05/2020 0834   TRIG 44 02/05/2020 0834   HDL 70 02/05/2020 0834   CHOLHDL 2.2 02/05/2020 0834   LDLCALC 72 02/05/2020 0834    Additional studies/ records that were reviewed today include:  Office noted from PCP    ASSESSMENT:    1. Coronary artery disease involving native coronary artery of native heart without angina pectoris   2. Benign essential HTN   3. Pure hypercholesterolemia   4. Palpitations      PLAN:  In order of problems listed above:  1.  ASCAD -coronary CTA showed non obstructive CAD with 25-49% mLAD stenosis and normal FFR -she has not had any further atypical CP -continue statin and ASA  2.  HTN -BP is well controlled on exam today -Continue prescription drug management with Microzide 12.5 mg daily with as needed refills -I have personally reviewed and interpreted outside labs performed by patient's PCP which showed serum creatinine 0.86 and potassium 4.4 on 10/22/2021  3.  HLD -LDL goal < 70 -I have personally reviewed and interpreted outside  labs performed by patient's PCP which showed LDL 76, HDL 77, triglycerides 62, ALT 41 on 10/22/2021 -Increase atorvastatin to 40 mg daily -Repeat FLP and ALT in 6 weeks  4.  Palpitations -this occurs very rarely and only lasts 5 minutes -Since they are so infrequent I have recommended that she purchase a Kardio Mobile device to  try to catch them and can send me strips on My Chart   Medication Adjustments/Labs and Tests Ordered: Current medicines are reviewed at length with the patient today.  Concerns regarding medicines are outlined above.  Medication changes, Labs and Tests ordered today are listed in the Patient Instructions below.  There are no Patient Instructions on file for this visit.   Signed, Fransico Him, MD  01/19/2022 9:03 AM    [facility] [address], [place], [place]  [address] Phone: [phone]; Fax: [phone]

## 2022-01-19 NOTE — Addendum Note (Signed)
Addended by: Thompson Grayer on: 01/19/2022 09:12 AM   Modules accepted: Orders

## 2022-01-19 NOTE — Patient Instructions (Signed)
Medication Instructions:  Your physician has recommended you make the following change in your medication: Increase Atorvastatin to 40 mg by mouth daily  *If you need a refill on your cardiac medications before your next appointment, please call your pharmacy*   Lab Work: Your physician recommends that you return for lab work on March 10, 2022.  Lipid and ALT.  This will be fasting.  The lab opens at 7:15 AM  If you have labs (blood work) drawn today and your tests are completely normal, you will receive your results only by: [place] (if you have MyChart) OR A paper copy in the mail If you have any lab test that is abnormal or we need to change your treatment, we will call you to review the results.   Testing/Procedures: none   Follow-Up: At [facility], you and your health needs are our priority.  As part of our continuing mission to provide you with exceptional heart care, we have created designated Provider Care Teams.  These Care Teams include your primary Cardiologist (physician) and Advanced Practice Providers (APPs -  Physician Assistants and Nurse Practitioners) who all work together to provide you with the care you need, when you need it.  We recommend signing up for the patient portal called "MyChart".  Sign up information is provided on this After Visit Summary.  MyChart is used to connect with patients for Virtual Visits (Telemedicine).  Patients are able to view lab/test results, encounter notes, upcoming appointments, etc.  Non-urgent messages can be sent to your provider as well.   To learn more about what you can do with MyChart, go to [email or web address].    Your next appointment:   12 month(s)  The format for your next appointment:   In Person  Provider:   Fransico Him, MD     Other Instructions Dr Radford Pax recommends you purchase a Kardia mobile to monitor palpitations.  Important Information About Sugar

## 2022-03-03 ENCOUNTER — Other Ambulatory Visit: Payer: Medicare Other

## 2022-03-03 DIAGNOSIS — E7800 Pure hypercholesterolemia, unspecified: Secondary | ICD-10-CM | POA: Diagnosis not present

## 2022-03-03 LAB — LIPID PANEL
Chol/HDL Ratio: 2 ratio (ref 0.0–4.4)
Cholesterol, Total: 155 mg/dL (ref 100–199)
HDL: 77 mg/dL (ref >39)
LDL Chol Calc (NIH): 68 mg/dL (ref 0–99)
Triglycerides: 43 mg/dL (ref 0–149)
VLDL Cholesterol Cal: 10 mg/dL (ref 5–40)

## 2022-03-03 LAB — ALT: ALT: 37 IU/L — ABNORMAL HIGH (ref 0–32)

## 2022-03-10 ENCOUNTER — Other Ambulatory Visit: Payer: Medicare Other

## 2022-03-11 DIAGNOSIS — J209 Acute bronchitis, unspecified: Secondary | ICD-10-CM | POA: Diagnosis not present

## 2022-04-05 DIAGNOSIS — H35363 Drusen (degenerative) of macula, bilateral: Secondary | ICD-10-CM | POA: Diagnosis not present

## 2022-04-06 DIAGNOSIS — J329 Chronic sinusitis, unspecified: Secondary | ICD-10-CM | POA: Diagnosis not present

## 2022-04-06 DIAGNOSIS — J040 Acute laryngitis: Secondary | ICD-10-CM | POA: Diagnosis not present

## 2022-04-06 DIAGNOSIS — J4521 Mild intermittent asthma with (acute) exacerbation: Secondary | ICD-10-CM | POA: Diagnosis not present

## 2022-04-14 DIAGNOSIS — Z23 Encounter for immunization: Secondary | ICD-10-CM | POA: Diagnosis not present

## 2022-04-21 ENCOUNTER — Other Ambulatory Visit: Payer: Self-pay | Admitting: Internal Medicine

## 2022-04-21 DIAGNOSIS — Z1231 Encounter for screening mammogram for malignant neoplasm of breast: Secondary | ICD-10-CM

## 2022-04-22 DIAGNOSIS — D126 Benign neoplasm of colon, unspecified: Secondary | ICD-10-CM | POA: Diagnosis not present

## 2022-04-22 DIAGNOSIS — R490 Dysphonia: Secondary | ICD-10-CM | POA: Diagnosis not present

## 2022-04-22 DIAGNOSIS — Z23 Encounter for immunization: Secondary | ICD-10-CM | POA: Diagnosis not present

## 2022-04-22 DIAGNOSIS — J4521 Mild intermittent asthma with (acute) exacerbation: Secondary | ICD-10-CM | POA: Diagnosis not present

## 2022-04-22 DIAGNOSIS — Z853 Personal history of malignant neoplasm of breast: Secondary | ICD-10-CM | POA: Diagnosis not present

## 2022-04-22 DIAGNOSIS — I2510 Atherosclerotic heart disease of native coronary artery without angina pectoris: Secondary | ICD-10-CM | POA: Diagnosis not present

## 2022-04-22 DIAGNOSIS — Z800 Family history of malignant neoplasm of digestive organs: Secondary | ICD-10-CM | POA: Diagnosis not present

## 2022-04-22 DIAGNOSIS — M1711 Unilateral primary osteoarthritis, right knee: Secondary | ICD-10-CM | POA: Diagnosis not present

## 2022-04-22 DIAGNOSIS — I10 Essential (primary) hypertension: Secondary | ICD-10-CM | POA: Diagnosis not present

## 2022-05-18 ENCOUNTER — Ambulatory Visit
Admission: RE | Admit: 2022-05-18 | Discharge: 2022-05-18 | Disposition: A | Discharge status: Home / Self Care | Payer: Medicare Other | Source: Ambulatory Visit | Attending: Internal Medicine | Admitting: Internal Medicine

## 2022-05-18 DIAGNOSIS — Z1231 Encounter for screening mammogram for malignant neoplasm of breast: Secondary | ICD-10-CM

## 2022-05-25 DIAGNOSIS — Z23 Encounter for immunization: Secondary | ICD-10-CM | POA: Diagnosis not present

## 2022-10-28 DIAGNOSIS — M1711 Unilateral primary osteoarthritis, right knee: Secondary | ICD-10-CM | POA: Diagnosis not present

## 2022-10-28 DIAGNOSIS — M85859 Other specified disorders of bone density and structure, unspecified thigh: Secondary | ICD-10-CM | POA: Diagnosis not present

## 2022-10-28 DIAGNOSIS — J301 Allergic rhinitis due to pollen: Secondary | ICD-10-CM | POA: Diagnosis not present

## 2022-10-28 DIAGNOSIS — Z853 Personal history of malignant neoplasm of breast: Secondary | ICD-10-CM | POA: Diagnosis not present

## 2022-10-28 DIAGNOSIS — I2510 Atherosclerotic heart disease of native coronary artery without angina pectoris: Secondary | ICD-10-CM | POA: Diagnosis not present

## 2022-10-28 DIAGNOSIS — Z0000 Encounter for general adult medical examination without abnormal findings: Secondary | ICD-10-CM | POA: Diagnosis not present

## 2022-10-28 DIAGNOSIS — Z1331 Encounter for screening for depression: Secondary | ICD-10-CM | POA: Diagnosis not present

## 2022-10-28 DIAGNOSIS — I10 Essential (primary) hypertension: Secondary | ICD-10-CM | POA: Diagnosis not present

## 2022-10-28 DIAGNOSIS — J45909 Unspecified asthma, uncomplicated: Secondary | ICD-10-CM | POA: Diagnosis not present

## 2022-10-28 DIAGNOSIS — D126 Benign neoplasm of colon, unspecified: Secondary | ICD-10-CM | POA: Diagnosis not present

## 2022-11-09 DIAGNOSIS — L820 Inflamed seborrheic keratosis: Secondary | ICD-10-CM | POA: Diagnosis not present

## 2022-11-09 DIAGNOSIS — D1801 Hemangioma of skin and subcutaneous tissue: Secondary | ICD-10-CM | POA: Diagnosis not present

## 2022-11-09 DIAGNOSIS — L814 Other melanin hyperpigmentation: Secondary | ICD-10-CM | POA: Diagnosis not present

## 2022-11-09 DIAGNOSIS — L570 Actinic keratosis: Secondary | ICD-10-CM | POA: Diagnosis not present

## 2022-11-09 DIAGNOSIS — D485 Neoplasm of uncertain behavior of skin: Secondary | ICD-10-CM | POA: Diagnosis not present

## 2022-11-09 DIAGNOSIS — D0462 Carcinoma in situ of skin of left upper limb, including shoulder: Secondary | ICD-10-CM | POA: Diagnosis not present

## 2022-11-09 DIAGNOSIS — L821 Other seborrheic keratosis: Secondary | ICD-10-CM | POA: Diagnosis not present

## 2022-11-17 DIAGNOSIS — Z23 Encounter for immunization: Secondary | ICD-10-CM | POA: Diagnosis not present

## 2022-12-07 DIAGNOSIS — C44629 Squamous cell carcinoma of skin of left upper limb, including shoulder: Secondary | ICD-10-CM | POA: Diagnosis not present

## 2022-12-07 DIAGNOSIS — W57XXXA Bitten or stung by nonvenomous insect and other nonvenomous arthropods, initial encounter: Secondary | ICD-10-CM | POA: Diagnosis not present

## 2022-12-27 DIAGNOSIS — H18413 Arcus senilis, bilateral: Secondary | ICD-10-CM | POA: Diagnosis not present

## 2022-12-27 DIAGNOSIS — H25043 Posterior subcapsular polar age-related cataract, bilateral: Secondary | ICD-10-CM | POA: Diagnosis not present

## 2022-12-27 DIAGNOSIS — H40013 Open angle with borderline findings, low risk, bilateral: Secondary | ICD-10-CM | POA: Diagnosis not present

## 2022-12-27 DIAGNOSIS — H2511 Age-related nuclear cataract, right eye: Secondary | ICD-10-CM | POA: Diagnosis not present

## 2022-12-27 DIAGNOSIS — H2513 Age-related nuclear cataract, bilateral: Secondary | ICD-10-CM | POA: Diagnosis not present

## 2023-02-08 DIAGNOSIS — L820 Inflamed seborrheic keratosis: Secondary | ICD-10-CM | POA: Diagnosis not present

## 2023-02-08 DIAGNOSIS — L905 Scar conditions and fibrosis of skin: Secondary | ICD-10-CM | POA: Diagnosis not present

## 2023-02-10 DIAGNOSIS — H2511 Age-related nuclear cataract, right eye: Secondary | ICD-10-CM | POA: Diagnosis not present

## 2023-02-10 DIAGNOSIS — H25011 Cortical age-related cataract, right eye: Secondary | ICD-10-CM | POA: Diagnosis not present

## 2023-02-10 DIAGNOSIS — H2512 Age-related nuclear cataract, left eye: Secondary | ICD-10-CM | POA: Diagnosis not present

## 2023-02-24 DIAGNOSIS — H2512 Age-related nuclear cataract, left eye: Secondary | ICD-10-CM | POA: Diagnosis not present

## 2023-03-15 DIAGNOSIS — L905 Scar conditions and fibrosis of skin: Secondary | ICD-10-CM | POA: Diagnosis not present

## 2023-03-15 DIAGNOSIS — D1801 Hemangioma of skin and subcutaneous tissue: Secondary | ICD-10-CM | POA: Diagnosis not present

## 2023-03-15 DIAGNOSIS — L814 Other melanin hyperpigmentation: Secondary | ICD-10-CM | POA: Diagnosis not present

## 2023-03-15 DIAGNOSIS — L570 Actinic keratosis: Secondary | ICD-10-CM | POA: Diagnosis not present

## 2023-03-15 DIAGNOSIS — L821 Other seborrheic keratosis: Secondary | ICD-10-CM | POA: Diagnosis not present

## 2023-04-25 DIAGNOSIS — Z23 Encounter for immunization: Secondary | ICD-10-CM | POA: Diagnosis not present

## 2023-04-27 DIAGNOSIS — Z23 Encounter for immunization: Secondary | ICD-10-CM | POA: Diagnosis not present

## 2023-04-28 ENCOUNTER — Other Ambulatory Visit: Payer: Self-pay | Admitting: Internal Medicine

## 2023-04-28 DIAGNOSIS — Z1231 Encounter for screening mammogram for malignant neoplasm of breast: Secondary | ICD-10-CM

## 2023-05-02 DIAGNOSIS — I10 Essential (primary) hypertension: Secondary | ICD-10-CM | POA: Diagnosis not present

## 2023-05-02 DIAGNOSIS — J45909 Unspecified asthma, uncomplicated: Secondary | ICD-10-CM | POA: Diagnosis not present

## 2023-05-02 DIAGNOSIS — I2510 Atherosclerotic heart disease of native coronary artery without angina pectoris: Secondary | ICD-10-CM | POA: Diagnosis not present

## 2023-05-17 NOTE — Progress Notes (Unsigned)
Cardiology Cardiology Note    Date:  05/18/2023   ID:  Tammy Holland, DOB [birth year], MRN [id number]  PCP:  Lorenda Ishihara, MD  Cardiologist:  Armanda Magic, MD   Chief Complaint  Patient presents with   Coronary Artery Disease   Hypertension   Hyperlipidemia   Palpitations    History of Present Illness:  Tammy Holland is a 82 y.o. female with a hx of asthma, breast CA, and HTN.  She has a remote hx of tobacco use and has a family hx of CAD in her brother who had a CABG.  She was referred to Cardiology for evaluation of atypical chest pain that was felt in the past to be due to GERD.  Coronary CTA was done due to persistence of CP and shoShewed 25-49% mLAD non obstructive disease with normal FFR.  She was started on statin for HLD with LDL goal < 70 and started on ASA.    She is here today for followup and is doing well.  She denies any chest pain or pressure,  PND, orthopnea, LE edema, palpitaitons or syncope.  She has chronic DOE in the summer due to the heat.  She has asthma so summers are hard for her.  Her breathing gets better when the temps decrease. She is compliant with her meds and is tolerating meds with no SE.    Past Medical History:  Diagnosis Date   Arthritis    oa   Asthma    Breast cancer (HCC)    CAD (coronary artery disease), native coronary artery    coronary CTA 2021 with 25-49% mLAD stenosis and normal FFR   Cancer (HCC) 2009   BREAST left partial mastectomy and radiation   Clinically significant macular edema with cotton wool spots    takes preservision for   Cold within last month   spitting up clear mucous now, no fevers   Dyspnea    with exertion only   Hearing loss    both ears   Hemorrhoids    HLD (hyperlipidemia)    LDL goal < 70   Hypertension    Personal history of radiation therapy     Past Surgical History:  Procedure Laterality Date   BREAST LUMPECTOMY     colonscopy     TONSILLECTOMY AND ADENOIDECTOMY  age 42   TOTAL  KNEE ARTHROPLASTY Left 07/04/2016   Procedure: LEFT TOTAL KNEE ARTHROPLASTY;  Surgeon: Ollen Gross, MD;  Location: [facility];  Service: Orthopedics;  Laterality: Left;   TOTAL KNEE ARTHROPLASTY Right 09/14/2020   Procedure: TOTAL KNEE ARTHROPLASTY;  Surgeon: Ollen Gross, MD;  Location: [facility];  Service: Orthopedics;  Laterality: Right;    varicose vein saline injections      Current Medications: Current Meds  Medication Sig   albuterol (PROVENTIL HFA;VENTOLIN HFA) 108 (90 Base) MCG/ACT inhaler Inhale 2 puffs into the lungs every 4 (four) hours as needed for wheezing or shortness of breath.   Artificial Tear Solution (SOOTHE XP) SOLN Place 1 drop into both eyes daily as needed (dry eyes).   aspirin EC 81 MG tablet Take 81 mg by mouth daily.   atorvastatin (LIPITOR) 40 MG tablet Take 1 tablet (40 mg total) by mouth daily.   bisacodyl (DULCOLAX) 5 MG EC tablet Take 5 mg by mouth daily as needed for moderate constipation.   fexofenadine (ALLEGRA) 180 MG tablet Take 180 mg by mouth daily.   fluticasone (FLONASE) 50 MCG/ACT nasal spray Place 1-2 sprays  into both nostrils daily as needed for allergies.   hydrochlorothiazide (MICROZIDE) 12.5 MG capsule Take 12.5 mg by mouth daily.    ketotifen (ZADITOR) 0.025 % ophthalmic solution Place 1 drop into both eyes 2 (two) times daily as needed (for allergy eyes).   Misc Natural Products (OSTEO BI-FLEX TRIPLE STRENGTH) TABS Take 2 tablets by mouth daily.   Multiple Vitamin (MULTIVITAMIN) capsule Take 1 capsule by mouth daily.   Omega-3 Fatty Acids (FISH OIL) 1200 MG CAPS Take 1 capsule by mouth daily.   OVER THE COUNTER MEDICATION Biotin and collagen liquid   oxyCODONE (OXY IR/ROXICODONE) 5 MG immediate release tablet Take 1-2 tablets (5-10 mg total) by mouth every 6 (six) hours as needed for severe pain.   oxymetazoline (AFRIN) 0.05 % nasal spray Place 1 spray into both nostrils 2 (two) times daily as needed for congestion.   polyethylene glycol  (MIRALAX / GLYCOLAX) 17 g packet Take 17 g by mouth daily as needed for moderate constipation.   sodium chloride (OCEAN) 0.65 % SOLN nasal spray Place 1-2 sprays into both nostrils 3 (three) times daily as needed for congestion.    Allergies:   Patient has no known allergies.   Social History   Socioeconomic History   Marital status: Married    Spouse name: Not on file   Number of children: Not on file   Years of education: Not on file   Highest education level: Not on file  Occupational History   Not on file  Tobacco Use   Smoking status: Former    Current packs/day: 0.00    Types: Cigarettes    Start date: 07/25/1946    Quit date: 07/25/1976    Years since quitting: 46.8   Smokeless tobacco: Never   Tobacco comments:    social smoking only  Vaping Use   Vaping status: Never Used  Substance and Sexual Activity   Alcohol use: Yes    Alcohol/week: 2.0 standard drinks of alcohol    Types: 2 Glasses of wine per week    Comment: per day.   Drug use: No   Sexual activity: Not on file  Other Topics Concern   Not on file  Social History Narrative   Not on file   Social Determinants of Health   Financial Resource Strain: Not on file  Food Insecurity: Not on file  Transportation Needs: Not on file  Physical Activity: Not on file  Stress: Not on file  Social Connections: Not on file     Family History:  The patient's family history includes Alcohol abuse in her brother; Breast cancer in her cousin; Cancer in her father; Colon cancer in her sister; Coronary artery disease in her brother; Depression in her brother; Emphysema in her mother; Hepatitis in her father; Hypertension in her sister; Stroke in her maternal grandmother.   ROS:   Please see the history of present illness.    ROS All other systems reviewed and are negative.      No data to display                 EKG Interpretation Date/Time:  Thursday May 18 2023 08:32:11 EDT Ventricular Rate:  67 PR  Interval:  140 QRS Duration:  82 QT Interval:  406 QTC Calculation: 429 R Axis:   85  Text Interpretation: Normal sinus rhythm Possible Left atrial enlargement nonspecific T wave abnormality When compared with ECG of 14-Dec-2007 14:20, Confirmed by Armanda Magic ([id number]) on 05/18/2023 8:40:00 AM  PHYSICAL EXAM:   VS:  BP 128/74 (BP Location: Left Arm, Patient Position: Sitting, Cuff Size: Normal)   Pulse 67   Ht 5\' 3"  (1.6 m)   Wt 130 lb 12.8 oz (59.3 kg)   SpO2 99%   BMI 23.17 kg/m    GEN: Well nourished, well developed in no acute distress HEENT: Normal NECK: No JVD; No carotid bruits LYMPHATICS: No lymphadenopathy CARDIAC:RRR, no murmurs, rubs, gallops RESPIRATORY:  Clear to auscultation without rales, wheezing or rhonchi  ABDOMEN: Soft, non-tender, non-distended MUSCULOSKELETAL:  No edema; No deformity  SKIN: Warm and dry NEUROLOGIC:  Alert and oriented x 3 PSYCHIATRIC:  Normal affect  Wt Readings from Last 3 Encounters:  05/18/23 130 lb 12.8 oz (59.3 kg)  01/19/22 129 lb 12.8 oz (58.9 kg)  09/14/20 130 lb 15.3 oz (59.4 kg)      Studies/Labs Reviewed:    Coronary CTA 11/2019   Aorta: Normal size. Scattered calcifications in the ascending and descending thoracic aorta. No dissection.   Aortic Valve:  Trileaflet.  No calcifications.   Coronary Arteries:  Normal coronary origin.  Right dominance.   RCA is a large dominant artery that gives rise to PDA and PLVB. There is no plaque.   Left main is a large artery that gives rise to LAD and LCX arteries. There is no plaque.   LAD is a large vessel that gives rise to 2 large branching D1 and D2 arteries. There is mild calcified plaque in the mid LAD with associated stenosis of 25-49%.   LCX is a non-dominant artery that gives rise to one small OM1 branch. There is no plaque in the proximal and mid LCx. The distal LCx is poorly visualized.   Other findings:   Normal pulmonary vein drainage into the left  atrium.   Normal let atrial appendage without a thrombus.   Normal size of the pulmonary artery.   Small PFO is present.   IMPRESSION: 1. Coronary calcium score of 18. This was 32nd percentile for age and sex matched control.   2.  Normal coronary origin with right dominance.   3.  Mild atherosclerosis of the mid LAD.  CAD-RADS 2.   4.  Consider non-atherosclerotic causes of chest pain.   5.  Recommend preventive therapy and risk factor modification.   6.  This study has been submitted for FFR analysis.   Signa Cheek    Recent Labs: No results found for requested labs within last 365 days.   Lipid Panel    Component Value Date/Time   CHOL 155 03/03/2022 0905   TRIG 43 03/03/2022 0905   HDL 77 03/03/2022 0905   CHOLHDL 2.0 03/03/2022 0905   LDLCALC 68 03/03/2022 0905    Additional studies/ records that were reviewed today include:  Office noted from PCP    ASSESSMENT:    1. Coronary artery disease involving native coronary artery of native heart without angina pectoris   2. Benign essential HTN   3. Pure hypercholesterolemia   4. Palpitations       PLAN:  In order of problems listed above:  1.  ASCAD -coronary CTA showed non obstructive CAD with 25-49% mLAD stenosis and normal FFR -She denies any chest pain since I saw her last -Continue prescription drug management with atorvastatin 40 mg daily and aspirin 81 mg daily with as needed refills  2.  HTN -BP is controlled on exam today -Continue prescription drug management with HCTZ 12.5 mg daily with as needed refills -  check BMET  3.  HLD -LDL goal < 70 -I have personally reviewed and interpreted outside labs performed by patient's PCP which showed LDL 74 and HDL 68 in April 2024 -repeat FLP and ALT  4.  Palpitations -she is not aware of any palpitations recently  Medication Adjustments/Labs and Tests Ordered: Current medicines are reviewed at length with the patient today.  Concerns regarding  medicines are outlined above.  Medication changes, Labs and Tests ordered today are listed in the Patient Instructions below.  There are no Patient Instructions on file for this visit.   Signed, Armanda Magic, MD  05/18/2023 8:37 AM    [facility] [address] [place], [place], [place]  [address] Phone: [phone]; Fax: [phone]

## 2023-05-18 ENCOUNTER — Ambulatory Visit: Payer: Medicare Other | Attending: Cardiology | Admitting: Cardiology

## 2023-05-18 ENCOUNTER — Encounter: Payer: Self-pay | Admitting: Cardiology

## 2023-05-18 VITALS — BP 128/74 | HR 67 | Ht 63.0 in | Wt 130.8 lb

## 2023-05-18 DIAGNOSIS — Z79899 Other long term (current) drug therapy: Secondary | ICD-10-CM | POA: Diagnosis not present

## 2023-05-18 DIAGNOSIS — E7800 Pure hypercholesterolemia, unspecified: Secondary | ICD-10-CM | POA: Insufficient documentation

## 2023-05-18 DIAGNOSIS — I2510 Atherosclerotic heart disease of native coronary artery without angina pectoris: Secondary | ICD-10-CM | POA: Insufficient documentation

## 2023-05-18 DIAGNOSIS — R002 Palpitations: Secondary | ICD-10-CM | POA: Insufficient documentation

## 2023-05-18 DIAGNOSIS — I10 Essential (primary) hypertension: Secondary | ICD-10-CM | POA: Diagnosis not present

## 2023-05-18 NOTE — Addendum Note (Signed)
Addended by: Luellen Pucker on: 05/18/2023 08:50 AM   Modules accepted: Orders

## 2023-05-18 NOTE — Patient Instructions (Signed)
Medication Instructions:  Your physician recommends that you continue on your current medications as directed. Please refer to the Current Medication list given to you today.  *If you need a refill on your cardiac medications before your next appointment, please call your pharmacy*   Lab Work: Please make an appointment to complete a FASTING lipid panel and a CMET in our office.  If you have labs (blood work) drawn today and your tests are completely normal, you will receive your results only by: MyChart Message (if you have MyChart) OR A paper copy in the mail If you have any lab test that is abnormal or we need to change your treatment, we will call you to review the results.   Testing/Procedures: None.   Follow-Up:  Your next appointment:   1 year(s)  Provider:   Armanda Magic, MD

## 2023-05-25 ENCOUNTER — Ambulatory Visit: Payer: Medicare Other | Attending: Cardiology

## 2023-05-25 DIAGNOSIS — Z79899 Other long term (current) drug therapy: Secondary | ICD-10-CM

## 2023-05-25 DIAGNOSIS — E7800 Pure hypercholesterolemia, unspecified: Secondary | ICD-10-CM

## 2023-05-26 LAB — COMPREHENSIVE METABOLIC PANEL
ALT: 32 [IU]/L (ref 0–32)
AST: 34 [IU]/L (ref 0–40)
Albumin: 4.4 g/dL (ref 3.7–4.7)
Alkaline Phosphatase: 58 [IU]/L (ref 44–121)
BUN/Creatinine Ratio: 24 (ref 12–28)
BUN: 18 mg/dL (ref 8–27)
Bilirubin Total: 0.3 mg/dL (ref 0.0–1.2)
CO2: 24 mmol/L (ref 20–29)
Calcium: 9.3 mg/dL (ref 8.7–10.3)
Chloride: 101 mmol/L (ref 96–106)
Creatinine, Ser: 0.75 mg/dL (ref 0.57–1.00)
Globulin, Total: 2 g/dL (ref 1.5–4.5)
Glucose: 99 mg/dL (ref 70–99)
Potassium: 4.6 mmol/L (ref 3.5–5.2)
Sodium: 138 mmol/L (ref 134–144)
Total Protein: 6.4 g/dL (ref 6.0–8.5)
eGFR: 80 mL/min/{1.73_m2} (ref >59)

## 2023-05-26 LAB — LIPID PANEL
Chol/HDL Ratio: 2.1 ratio (ref 0.0–4.4)
Cholesterol, Total: 147 mg/dL (ref 100–199)
HDL: 69 mg/dL (ref >39)
LDL Chol Calc (NIH): 69 mg/dL (ref 0–99)
Triglycerides: 38 mg/dL (ref 0–149)
VLDL Cholesterol Cal: 9 mg/dL (ref 5–40)

## 2023-05-29 ENCOUNTER — Telehealth: Payer: Self-pay

## 2023-05-29 NOTE — Telephone Encounter (Signed)
-----   Message from Armanda Magic sent at 05/28/2023  7:03 PM EST ----- Please let patient know that labs were normal.  Continue current medical therapy.

## 2023-05-29 NOTE — Telephone Encounter (Signed)
Call to discuss lab results, no answer, left detailed message per DPR explaining that labs were normal and Dr. Mayford Knife advises to continue on current medications. Advised patient to call our office if any questions.

## 2023-06-01 ENCOUNTER — Ambulatory Visit: Payer: Medicare Other

## 2023-06-14 DIAGNOSIS — L814 Other melanin hyperpigmentation: Secondary | ICD-10-CM | POA: Diagnosis not present

## 2023-06-14 DIAGNOSIS — D1801 Hemangioma of skin and subcutaneous tissue: Secondary | ICD-10-CM | POA: Diagnosis not present

## 2023-06-14 DIAGNOSIS — L821 Other seborrheic keratosis: Secondary | ICD-10-CM | POA: Diagnosis not present

## 2023-06-14 DIAGNOSIS — L905 Scar conditions and fibrosis of skin: Secondary | ICD-10-CM | POA: Diagnosis not present

## 2023-06-14 DIAGNOSIS — L570 Actinic keratosis: Secondary | ICD-10-CM | POA: Diagnosis not present

## 2023-06-19 DIAGNOSIS — J45909 Unspecified asthma, uncomplicated: Secondary | ICD-10-CM | POA: Diagnosis not present

## 2023-06-19 DIAGNOSIS — U071 COVID-19: Secondary | ICD-10-CM | POA: Diagnosis not present

## 2023-06-19 DIAGNOSIS — J209 Acute bronchitis, unspecified: Secondary | ICD-10-CM | POA: Diagnosis not present

## 2023-06-27 ENCOUNTER — Ambulatory Visit
Admission: RE | Admit: 2023-06-27 | Discharge: 2023-06-27 | Disposition: A | Discharge status: Home / Self Care | Payer: Medicare Other | Source: Ambulatory Visit | Attending: Internal Medicine | Admitting: Internal Medicine

## 2023-06-27 DIAGNOSIS — Z1231 Encounter for screening mammogram for malignant neoplasm of breast: Secondary | ICD-10-CM | POA: Diagnosis not present

## 2023-09-20 ENCOUNTER — Other Ambulatory Visit: Payer: Self-pay

## 2023-09-20 ENCOUNTER — Encounter (HOSPITAL_BASED_OUTPATIENT_CLINIC_OR_DEPARTMENT_OTHER): Payer: Self-pay

## 2023-09-20 ENCOUNTER — Emergency Department (HOSPITAL_BASED_OUTPATIENT_CLINIC_OR_DEPARTMENT_OTHER)
Admission: EM | Admit: 2023-09-20 | Discharge: 2023-09-21 | Disposition: A | Discharge status: Home / Self Care | Payer: Medicare Other | Attending: Emergency Medicine | Admitting: Emergency Medicine

## 2023-09-20 DIAGNOSIS — I10 Essential (primary) hypertension: Secondary | ICD-10-CM | POA: Insufficient documentation

## 2023-09-20 DIAGNOSIS — Z7982 Long term (current) use of aspirin: Secondary | ICD-10-CM | POA: Diagnosis not present

## 2023-09-20 DIAGNOSIS — Z79899 Other long term (current) drug therapy: Secondary | ICD-10-CM | POA: Insufficient documentation

## 2023-09-20 DIAGNOSIS — R42 Dizziness and giddiness: Secondary | ICD-10-CM | POA: Insufficient documentation

## 2023-09-20 LAB — COMPREHENSIVE METABOLIC PANEL
ALT: 33 U/L (ref 0–44)
AST: 33 U/L (ref 15–41)
Albumin: 4.5 g/dL (ref 3.5–5.0)
Alkaline Phosphatase: 50 U/L (ref 38–126)
Anion gap: 14 (ref 5–15)
BUN: 25 mg/dL — ABNORMAL HIGH (ref 8–23)
CO2: 23 mmol/L (ref 22–32)
Calcium: 9.7 mg/dL (ref 8.9–10.3)
Chloride: 102 mmol/L (ref 98–111)
Creatinine, Ser: 0.79 mg/dL (ref 0.44–1.00)
GFR, Estimated: >60 mL/min (ref >60)
Glucose, Bld: 119 mg/dL — ABNORMAL HIGH (ref 70–99)
Potassium: 3.6 mmol/L (ref 3.5–5.1)
Sodium: 139 mmol/L (ref 135–145)
Total Bilirubin: 0.4 mg/dL (ref 0.0–1.2)
Total Protein: 7.1 g/dL (ref 6.5–8.1)

## 2023-09-20 LAB — TROPONIN I (HIGH SENSITIVITY): Troponin I (High Sensitivity): 5 ng/L (ref <18)

## 2023-09-20 LAB — CBC
HCT: 33.8 % — ABNORMAL LOW (ref 36.0–46.0)
Hemoglobin: 11.1 g/dL — ABNORMAL LOW (ref 12.0–15.0)
MCH: 28.1 pg (ref 26.0–34.0)
MCHC: 32.8 g/dL (ref 30.0–36.0)
MCV: 85.6 fL (ref 80.0–100.0)
Platelets: 299 10*3/uL (ref 150–400)
RBC: 3.95 MIL/uL (ref 3.87–5.11)
RDW: 16.6 % — ABNORMAL HIGH (ref 11.5–15.5)
WBC: 9.3 10*3/uL (ref 4.0–10.5)
nRBC: 0 % (ref 0.0–0.2)

## 2023-09-20 NOTE — ED Triage Notes (Signed)
 This week has felt "tingly" in both hands. Also sts palpitations recently.   Says BP has been ~130s/80's for last several months but has been ~160's this week.   Compliant on hydrochlorothiazide.

## 2023-09-21 ENCOUNTER — Emergency Department (HOSPITAL_BASED_OUTPATIENT_CLINIC_OR_DEPARTMENT_OTHER): Payer: Medicare Other

## 2023-09-21 DIAGNOSIS — R519 Headache, unspecified: Secondary | ICD-10-CM | POA: Diagnosis not present

## 2023-09-21 DIAGNOSIS — I10 Essential (primary) hypertension: Secondary | ICD-10-CM | POA: Diagnosis not present

## 2023-09-21 LAB — TROPONIN I (HIGH SENSITIVITY): Troponin I (High Sensitivity): 6 ng/L (ref <18)

## 2023-09-22 NOTE — ED Provider Notes (Signed)
 [place] EMERGENCY DEPARTMENT AT [facility] Provider Note   CSN: [id number] Arrival date & time: 09/20/23  2021     History  Chief Complaint  Patient presents with   Hypertension    Tammy Holland is a 83 y.o. female.  83 year old female presents ER today secondary to high blood pressure.  Patient had some dizziness previously, some paresthesias in her fingers however over the last week she is also noticed elevated blood pressures even on her baseline hydrochlorothiazide.  She has some increased stress in her life recently and attributes some of it to that possibly.  No chest pain, shortness of breath, loss of vision, headache, other stroke symptoms or neurologic symptoms, normal urination.   Hypertension       Home Medications Prior to Admission medications   Medication Sig Start Date End Date Taking? Authorizing Provider  albuterol (PROVENTIL HFA;VENTOLIN HFA) 108 (90 Base) MCG/ACT inhaler Inhale 2 puffs into the lungs every 4 (four) hours as needed for wheezing or shortness of breath.    [provider]  Artificial Tear Solution (SOOTHE XP) SOLN Place 1 drop into both eyes daily as needed (dry eyes).    [provider]  aspirin EC 81 MG tablet Take 81 mg by mouth daily. 12/10/19   [provider]  atorvastatin (LIPITOR) 40 MG tablet Take 1 tablet (40 mg total) by mouth daily. 01/19/22   Quintella Reichert, MD  bisacodyl (DULCOLAX) 5 MG EC tablet Take 5 mg by mouth daily as needed for moderate constipation.    [provider]  budesonide-formoterol (SYMBICORT) 80-4.5 MCG/ACT inhaler Inhale 2 puffs into the lungs 2 (two) times daily. Patient not taking: Reported on 05/18/2023    [provider]  fexofenadine (ALLEGRA) 180 MG tablet Take 180 mg by mouth daily.    [provider]  fluticasone (FLONASE) 50 MCG/ACT nasal spray Place 1-2 sprays into both nostrils daily as needed for allergies.    [provider]   hydrochlorothiazide (MICROZIDE) 12.5 MG capsule Take 12.5 mg by mouth daily.  06/25/11   [provider]  ketotifen (ZADITOR) 0.025 % ophthalmic solution Place 1 drop into both eyes 2 (two) times daily as needed (for allergy eyes).    [provider]  Misc Natural Products (OSTEO BI-FLEX TRIPLE STRENGTH) TABS Take 2 tablets by mouth daily.    [provider]  Multiple Vitamin (MULTIVITAMIN) capsule Take 1 capsule by mouth daily.    [provider]  Omega-3 Fatty Acids (FISH OIL) 1200 MG CAPS Take 1 capsule by mouth daily.    [provider]  OVER THE COUNTER MEDICATION Biotin and collagen liquid    [provider]  oxyCODONE (OXY IR/ROXICODONE) 5 MG immediate release tablet Take 1-2 tablets (5-10 mg total) by mouth every 6 (six) hours as needed for severe pain. 09/15/20   Nelia Shi D, PA-C  oxymetazoline (AFRIN) 0.05 % nasal spray Place 1 spray into both nostrils 2 (two) times daily as needed for congestion.    [provider]  polyethylene glycol (MIRALAX / GLYCOLAX) 17 g packet Take 17 g by mouth daily as needed for moderate constipation.    [provider]  sodium chloride (OCEAN) 0.65 % SOLN nasal spray Place 1-2 sprays into both nostrils 3 (three) times daily as needed for congestion.    [provider]      Allergies    Turmeric    Review of Systems   Review of Systems  Physical  Exam Updated Vital Signs BP (!) 157/64 (BP Location: Right Arm)   Pulse 65   Temp 98.3 F (36.8 C) (Oral)   Resp 16   Ht 5\' 3"  (1.6 m)   Wt 58.5 kg   SpO2 97%   BMI 22.85 kg/m  Physical Exam Vitals and nursing note reviewed.  Constitutional:      Appearance: She is well-developed.  HENT:     Head: Normocephalic and atraumatic.  Cardiovascular:     Rate and Rhythm: Normal rate and regular rhythm.  Pulmonary:     Effort: No respiratory distress.     Breath sounds: No stridor.  Abdominal:     General: There is  no distension.  Musculoskeletal:     Cervical back: Normal range of motion.  Neurological:     Mental Status: She is alert.     ED Results / Procedures / Treatments   Labs (all labs ordered are listed, but only abnormal results are displayed) Labs Reviewed  CBC - Abnormal; Notable for the following components:      Result Value   Hemoglobin 11.1 (*)    HCT 33.8 (*)    RDW 16.6 (*)    All other components within normal limits  COMPREHENSIVE METABOLIC PANEL - Abnormal; Notable for the following components:   Glucose, Bld 119 (*)    BUN 25 (*)    All other components within normal limits  TROPONIN I (HIGH SENSITIVITY)  TROPONIN I (HIGH SENSITIVITY)    EKG EKG Interpretation Date/Time:  Wednesday September 20 2023 21:02:57 EST Ventricular Rate:  75 PR Interval:  138 QRS Duration:  82 QT Interval:  390 QTC Calculation: 435 R Axis:   88  Text Interpretation: Normal sinus rhythm Biatrial enlargement ST & T wave abnormality, consider anterior ischemia Abnormal ECG When compared with ECG of 18-May-2023 08:32, Nonspecific T wave abnormality now evident in Inferior leads T wave inversion now evident in Lateral leads If clinical findings warrant, repeat tracings suggested Confirmed by Glyn Ade [phone]) on 09/21/2023 4:07:13 PM  Radiology CT Head Wo Contrast Result Date: 09/21/2023 CLINICAL DATA:  Headache EXAM: CT HEAD WITHOUT CONTRAST TECHNIQUE: Contiguous axial images were obtained from the base of the skull through the vertex without intravenous contrast. RADIATION DOSE REDUCTION: This exam was performed according to the departmental dose-optimization program which includes automated exposure control, adjustment of the mA and/or kV according to patient size and/or use of iterative reconstruction technique. COMPARISON:  None Available. FINDINGS: Brain: No acute intracranial abnormality. Specifically, no hemorrhage, hydrocephalus, mass lesion, acute infarction, or significant  intracranial injury. Vascular: No hyperdense vessel or unexpected calcification. Skull: No acute calvarial abnormality. Sinuses/Orbits: No acute findings Other: None IMPRESSION: No acute intracranial abnormality. Electronically Signed   By: Charlett Nose M.D.   On: 09/21/2023 01:17    Procedures Procedures    Medications Ordered in ED Medications - No data to display  ED Course/ Medical Decision Making/ A&P                                 Medical Decision Making Amount and/or Complexity of Data Reviewed Labs: ordered. Radiology: ordered.   Hypertension. Likely life related. Investigated symptoms and no e/o end organ damage from the same. Feels well. Still intermittently elevated even here but no longer having symptoms. Will continue to monitor and fu w/ pcp for same. Return here for any new symptoms or concerns.  Final Clinical Impression(s) / ED Diagnoses Final diagnoses:  Hypertension, unspecified type    Rx / DC Orders ED Discharge Orders          Ordered    Ambulatory referral to Pulmonology        09/21/23 0211              Averie Hornbaker, Barbara Cower, MD 09/22/23 [phone]

## 2023-09-27 DIAGNOSIS — J45901 Unspecified asthma with (acute) exacerbation: Secondary | ICD-10-CM | POA: Diagnosis not present

## 2023-09-27 DIAGNOSIS — D649 Anemia, unspecified: Secondary | ICD-10-CM | POA: Diagnosis not present

## 2023-09-27 DIAGNOSIS — I10 Essential (primary) hypertension: Secondary | ICD-10-CM | POA: Diagnosis not present

## 2023-09-27 DIAGNOSIS — R519 Headache, unspecified: Secondary | ICD-10-CM | POA: Diagnosis not present

## 2023-10-03 ENCOUNTER — Encounter: Payer: Self-pay | Admitting: Cardiology

## 2023-10-03 ENCOUNTER — Ambulatory Visit: Attending: Cardiology | Admitting: Cardiology

## 2023-10-03 VITALS — BP 116/80 | HR 84 | Ht 64.0 in | Wt 130.6 lb

## 2023-10-03 DIAGNOSIS — E7800 Pure hypercholesterolemia, unspecified: Secondary | ICD-10-CM | POA: Insufficient documentation

## 2023-10-03 DIAGNOSIS — I10 Essential (primary) hypertension: Secondary | ICD-10-CM | POA: Diagnosis present

## 2023-10-03 DIAGNOSIS — R0602 Shortness of breath: Secondary | ICD-10-CM | POA: Diagnosis not present

## 2023-10-03 DIAGNOSIS — R002 Palpitations: Secondary | ICD-10-CM | POA: Diagnosis not present

## 2023-10-03 DIAGNOSIS — I2510 Atherosclerotic heart disease of native coronary artery without angina pectoris: Secondary | ICD-10-CM | POA: Diagnosis not present

## 2023-10-03 NOTE — Patient Instructions (Signed)
 Medication Instructions:  Your physician recommends that you continue on your current medications as directed. Please refer to the Current Medication list given to you today.  *If you need a refill on your cardiac medications before your next appointment, please call your pharmacy*   Lab Work: NONE  If you have labs (blood work) drawn today and your tests are completely normal, you will receive your results only by: MyChart Message (if you have MyChart) OR A paper copy in the mail If you have any lab test that is abnormal or we need to change your treatment, we will call you to review the results.   Testing/Procedures: Your physician has requested that you have a Cardiac Stress PET Scan.  Your physician has requested that you have an echocardiogram. Echocardiography is a painless test that uses sound waves to create images of your heart. It provides your doctor with information about the size and shape of your heart and how well your heart's chambers and valves are working. This procedure takes approximately one hour. There are no restrictions for this procedure. Please do NOT wear cologne, perfume, aftershave, or lotions (deodorant is allowed). Please arrive 15 minutes prior to your appointment time.  Please note: We ask at that you not bring children with you during ultrasound (echo/ vascular) testing. Due to room size and safety concerns, children are not allowed in the ultrasound rooms during exams. Our front office staff cannot provide observation of children in our lobby area while testing is being conducted. An adult accompanying a patient to their appointment will only be allowed in the ultrasound room at the discretion of the ultrasound technician under special circumstances. We apologize for any inconvenience.    Follow-Up: At [facility], you and your health needs are our priority.  As part of our continuing mission to provide you with exceptional heart care, we have  created designated Provider Care Teams.  These Care Teams include your primary Cardiologist (physician) and Advanced Practice Providers (APPs -  Physician Assistants and Nurse Practitioners) who all work together to provide you with the care you need, when you need it.   Your next appointment:   1 year(s)  Provider:   Armanda Magic, MD     Other Instructions   Please report to Radiology at the [facility] Main Entrance 30 minutes early for your test.  [address] [place], [place] [address]                         OR   Please report to Radiology at [facility] Main Entrance, medical mall, 30 mins prior to your test.  [address]  [place], [place]  How to Prepare for Your Cardiac PET/CT Stress Test:  Nothing to eat or drink, except water, 3 hours prior to arrival time.  NO caffeine/decaffeinated products, or chocolate 12 hours prior to arrival. (Please note decaffeinated beverages (teas/coffees) still contain caffeine).  If you have caffeine within 12 hours prior, the test will need to be rescheduled.  Medication instructions: Do not take erectile dysfunction medications for 72 hours prior to test (sildenafil, tadalafil) Do not take nitrates (isosorbide mononitrate, Ranexa) the day before or day of test Do not take tamsulosin the day before or morning of test Hold theophylline containing medications for 12 hours. Hold Dipyridamole 48 hours prior to the test.  Diabetic Preparation: If able to eat breakfast prior to 3 hour fasting, you may  take all medications, including your insulin. Do not worry if you miss your breakfast dose of insulin - start at your next meal. If you do not eat prior to 3 hour fast-Hold all diabetes (oral and insulin) medications. Patients who wear a continuous glucose monitor MUST remove the device prior to scanning.  You may take your remaining medications with water.  NO perfume, cologne or lotion on chest or  abdomen area. FEMALES - Please avoid wearing dresses to this appointment.  Total time is 1 to 2 hours; you may want to bring reading material for the waiting time.  IF YOU THINK YOU MAY BE PREGNANT, OR ARE NURSING PLEASE INFORM THE TECHNOLOGIST.  In preparation for your appointment, medication and supplies will be purchased.  Appointment availability is limited, so if you need to cancel or reschedule, please call the Radiology Department Scheduler at [phone] 24 hours in advance to avoid a cancellation fee of $100.00  What to Expect When you Arrive:  Once you arrive and check in for your appointment, you will be taken to a preparation room within the Radiology Department.  A technologist or Nurse will obtain your medical history, verify that you are correctly prepped for the exam, and explain the procedure.  Afterwards, an IV will be started in your arm and electrodes will be placed on your skin for EKG monitoring during the stress portion of the exam. Then you will be escorted to the PET/CT scanner.  There, staff will get you positioned on the scanner and obtain a blood pressure and EKG.  During the exam, you will continue to be connected to the EKG and blood pressure machines.  A small, safe amount of a radioactive tracer will be injected in your IV to obtain a series of pictures of your heart along with an injection of a stress agent.    After your Exam:  It is recommended that you eat a meal and drink a caffeinated beverage to counter act any effects of the stress agent.  Drink plenty of fluids for the remainder of the day and urinate frequently for the first couple of hours after the exam.  Your doctor will inform you of your test results within 7-10 business days.  For more information and frequently asked questions, please visit our website: [email or web address]  For questions about your test or how to prepare for your test, please call: Cardiac Imaging Nurse  [facility] Office: [phone]

## 2023-10-03 NOTE — Progress Notes (Signed)
 Cardiology Cardiology Note    Date:  10/03/2023   ID:  Tammy Holland, DOB 1941-01-12, MRN [id number]  PCP:  Lorenda Ishihara, MD  Cardiologist:  Armanda Magic, MD   Chief Complaint  Patient presents with   Coronary Artery Disease   Hypertension   Hyperlipidemia   Palpitations    History of Present Illness:  Tammy Holland is a 83 y.o. female with a hx of asthma, breast CA, and HTN.  She has a remote hx of tobacco use and has a family hx of CAD in her brother who had a CABG.  She was referred to Cardiology for evaluation of atypical chest pain that was felt in the past to be due to GERD.  Coronary CTA was done due to persistence of CP and showed 25-49% mLAD non obstructive disease with normal FFR.  She was started on statin for HLD with LDL goal < 70 and started on ASA.    She is here today for followup and is doing well.  She has been having more SOB than she had before and takes longer to go away after sitting. She denies any chest pain or pressure, PND, orthopnea, LE edema,  palpitations or syncope. SHe is compliant with her meds and is tolerating meds with no SE.    Past Medical History:  Diagnosis Date   Arthritis    oa   Asthma    Breast cancer (HCC)    CAD (coronary artery disease), native coronary artery    coronary CTA 2021 with 25-49% mLAD stenosis and normal FFR   Cancer (HCC) 2009   BREAST left partial mastectomy and radiation   Clinically significant macular edema with cotton wool spots    takes preservision for   Cold within last month   spitting up clear mucous now, no fevers   Dyspnea    with exertion only   Hearing loss    both ears   Hemorrhoids    HLD (hyperlipidemia)    LDL goal < 70   Hypertension    Personal history of radiation therapy     Past Surgical History:  Procedure Laterality Date   BREAST LUMPECTOMY     colonscopy     TONSILLECTOMY AND ADENOIDECTOMY  age 87   TOTAL KNEE ARTHROPLASTY Left 07/04/2016   Procedure: LEFT TOTAL  KNEE ARTHROPLASTY;  Surgeon: Ollen Gross, MD;  Location: [facility];  Service: Orthopedics;  Laterality: Left;   TOTAL KNEE ARTHROPLASTY Right 09/14/2020   Procedure: TOTAL KNEE ARTHROPLASTY;  Surgeon: Ollen Gross, MD;  Location: [facility];  Service: Orthopedics;  Laterality: Right;    varicose vein saline injections      Current Medications: Current Meds  Medication Sig   albuterol (PROVENTIL HFA;VENTOLIN HFA) 108 (90 Base) MCG/ACT inhaler Inhale 2 puffs into the lungs every 4 (four) hours as needed for wheezing or shortness of breath.   Artificial Tear Solution (SOOTHE XP) SOLN Place 1 drop into both eyes daily as needed (dry eyes).   aspirin EC 81 MG tablet Take 81 mg by mouth daily.   atorvastatin (LIPITOR) 40 MG tablet Take 1 tablet (40 mg total) by mouth daily.   bisacodyl (DULCOLAX) 5 MG EC tablet Take 5 mg by mouth daily as needed for moderate constipation.   fexofenadine (ALLEGRA) 180 MG tablet Take 180 mg by mouth daily.   fluticasone (FLONASE) 50 MCG/ACT nasal spray Place 1-2 sprays into both nostrils daily as needed for allergies.   hydrochlorothiazide (MICROZIDE) 12.5  MG capsule Take 12.5 mg by mouth daily.    metoprolol succinate (TOPROL-XL) 25 MG 24 hr tablet Take 25 mg by mouth daily.   Misc Natural Products (OSTEO BI-FLEX TRIPLE STRENGTH) TABS Take 2 tablets by mouth daily.   Multiple Vitamin (MULTIVITAMIN) capsule Take 1 capsule by mouth daily.   Omega-3 Fatty Acids (FISH OIL) 1200 MG CAPS Take 1 capsule by mouth daily.   OVER THE COUNTER MEDICATION Biotin and collagen liquid   oxymetazoline (AFRIN) 0.05 % nasal spray Place 1 spray into both nostrils 2 (two) times daily as needed for congestion.   polyethylene glycol (MIRALAX / GLYCOLAX) 17 g packet Take 17 g by mouth daily as needed for moderate constipation.   sodium chloride (OCEAN) 0.65 % SOLN nasal spray Place 1-2 sprays into both nostrils 3 (three) times daily as needed for congestion.   WIXELA INHUB 100-50 MCG/ACT  AEPB Inhale 1 puff into the lungs 2 (two) times daily.    Allergies:   Turmeric   Social History   Socioeconomic History   Marital status: Married    Spouse name: Not on file   Number of children: Not on file   Years of education: Not on file   Highest education level: Not on file  Occupational History   Not on file  Tobacco Use   Smoking status: Former    Current packs/day: 0.00    Types: Cigarettes    Start date: 07/25/1946    Quit date: 07/25/1976    Years since quitting: 47.2   Smokeless tobacco: Never   Tobacco comments:    social smoking only  Vaping Use   Vaping status: Never Used  Substance and Sexual Activity   Alcohol use: Yes    Alcohol/week: 2.0 standard drinks of alcohol    Types: 2 Glasses of wine per week    Comment: per day.   Drug use: No   Sexual activity: Not on file  Other Topics Concern   Not on file  Social History Narrative   Not on file   Social Drivers of Health   Financial Resource Strain: Not on file  Food Insecurity: Not on file  Transportation Needs: Not on file  Physical Activity: Not on file  Stress: Not on file  Social Connections: Not on file     Family History:  The patient's family history includes Alcohol abuse in her brother; Breast cancer in her cousin; Cancer in her father; Colon cancer in her sister; Coronary artery disease in her brother; Depression in her brother; Emphysema in her mother; Hepatitis in her father; Hypertension in her sister; Stroke in her maternal grandmother.   ROS:   Please see the history of present illness.    ROS All other systems reviewed and are negative.      No data to display                      PHYSICAL EXAM:   VS:  BP 116/80   Pulse 84   Ht 5\' 4"  (1.626 m)   Wt 130 lb 9.6 oz (59.2 kg)   SpO2 94%   BMI 22.42 kg/m    GEN: Well nourished, well developed in no acute distress HEENT: Normal NECK: No JVD; No carotid bruits LYMPHATICS: No lymphadenopathy CARDIAC:RRR, no murmurs,  rubs, gallops RESPIRATORY:  Clear to auscultation without rales, wheezing or rhonchi  ABDOMEN: Soft, non-tender, non-distended MUSCULOSKELETAL:  No edema; No deformity  SKIN: Warm and dry NEUROLOGIC:  Alert  and oriented x 3 PSYCHIATRIC:  Normal affect  Wt Readings from Last 3 Encounters:  10/03/23 130 lb 9.6 oz (59.2 kg)  09/20/23 129 lb (58.5 kg)  05/18/23 130 lb 12.8 oz (59.3 kg)      Studies/Labs Reviewed:    Coronary CTA 11/2019   Aorta: Normal size. Scattered calcifications in the ascending and descending thoracic aorta. No dissection.   Aortic Valve:  Trileaflet.  No calcifications.   Coronary Arteries:  Normal coronary origin.  Right dominance.   RCA is a large dominant artery that gives rise to PDA and PLVB. There is no plaque.   Left main is a large artery that gives rise to LAD and LCX arteries. There is no plaque.   LAD is a large vessel that gives rise to 2 large branching D1 and D2 arteries. There is mild calcified plaque in the mid LAD with associated stenosis of 25-49%.   LCX is a non-dominant artery that gives rise to one small OM1 branch. There is no plaque in the proximal and mid LCx. The distal LCx is poorly visualized.   Other findings:   Normal pulmonary vein drainage into the left atrium.   Normal let atrial appendage without a thrombus.   Normal size of the pulmonary artery.   Small PFO is present.   IMPRESSION: 1. Coronary calcium score of 18. This was 32nd percentile for age and sex matched control.   2.  Normal coronary origin with right dominance.   3.  Mild atherosclerosis of the mid LAD.  CAD-RADS 2.   4.  Consider non-atherosclerotic causes of chest pain.   5.  Recommend preventive therapy and risk factor modification.   6.  This study has been submitted for FFR analysis.   Armanda Magic    Recent Labs: 09/20/2023: ALT 33; BUN 25; Creatinine, Ser 0.79; Hemoglobin 11.1; Platelets 299; Potassium 3.6; Sodium 139   Lipid  Panel    Component Value Date/Time   CHOL 147 05/25/2023 1003   TRIG 38 05/25/2023 1003   HDL 69 05/25/2023 1003   CHOLHDL 2.1 05/25/2023 1003   LDLCALC 69 05/25/2023 1003    Additional studies/ records that were reviewed today include:  Office noted from PCP    ASSESSMENT:    1. Coronary artery disease involving native coronary artery of native heart without angina pectoris   2. Benign essential HTN   3. Pure hypercholesterolemia   4. Palpitations        PLAN:  In order of problems listed above:  1.  ASCAD -coronary CTA showed non obstructive CAD with 25-49% mLAD stenosis and normal FFR -Denies any chest pain but has been having more SOB that lasts longer with exertion -will get a Stress PET CT to rule out ischemia -Informed Consent   Shared Decision Making/Informed Consent The risks [chest pain, shortness of breath, cardiac arrhythmias, dizziness, blood pressure fluctuations, myocardial infarction, stroke/transient ischemic attack, nausea, vomiting, allergic reaction, radiation exposure, metallic taste sensation and life-threatening complications (estimated to be 1 in 10,000)], benefits (risk stratification, diagnosing coronary artery disease, treatment guidance) and alternatives of a cardiac PET stress test were discussed in detail with Ms. Hoos and she agrees to proceed. -check 2D echo -Continue prescription drug management with aspirin 81 mg daily and atorvastatin 40 mg daily with as needed refills  2.  HTN -BP is controlled on exam today -Continue prescription drug management with HCTZ 12.5 mg daily and Toprol XL 25mg  daily with as needed refills -I have personally  reviewed and interpreted outside labs performed by patient's PCP which showed serum creatinine 0.79 potassium 3.6 on 09/20/2023  3.  HLD -LDL goal < 70 -I have personally reviewed and interpreted outside labs performed by patient's PCP which showed LDL 69 HDL 69 on 05/25/2023, ALT 33 on  09/20/2023 -Continue prescription drug management with atorvastatin 40 mg daily with as needed refills  4.  Palpitations -she is not aware of any palpitations recently  Medication Adjustments/Labs and Tests Ordered: Current medicines are reviewed at length with the patient today.  Concerns regarding medicines are outlined above.  Medication changes, Labs and Tests ordered today are listed in the Patient Instructions below.  There are no Patient Instructions on file for this visit.   Signed, Armanda Magic, MD  10/03/2023 3:03 PM    [facility] [address] [place], [place], [place]  [address] Phone: [phone]; Fax: [phone]

## 2023-10-03 NOTE — Addendum Note (Signed)
 Addended by: Macie Burows on: 10/03/2023 03:14 PM   Modules accepted: Orders

## 2023-11-07 ENCOUNTER — Ambulatory Visit (HOSPITAL_COMMUNITY): Attending: Cardiology

## 2023-11-07 ENCOUNTER — Ambulatory Visit: Admitting: Cardiology

## 2023-11-07 DIAGNOSIS — R0602 Shortness of breath: Secondary | ICD-10-CM | POA: Insufficient documentation

## 2023-11-07 DIAGNOSIS — I2510 Atherosclerotic heart disease of native coronary artery without angina pectoris: Secondary | ICD-10-CM | POA: Insufficient documentation

## 2023-11-07 DIAGNOSIS — Z0000 Encounter for general adult medical examination without abnormal findings: Secondary | ICD-10-CM | POA: Diagnosis not present

## 2023-11-07 DIAGNOSIS — Z1331 Encounter for screening for depression: Secondary | ICD-10-CM | POA: Diagnosis not present

## 2023-11-07 DIAGNOSIS — Z23 Encounter for immunization: Secondary | ICD-10-CM | POA: Diagnosis not present

## 2023-11-07 LAB — ECHOCARDIOGRAM COMPLETE
Area-P 1/2: 3.99 cm2
MV M vel: 6.19 m/s
MV Peak grad: 153.3 mmHg
S' Lateral: 2.4 cm

## 2023-11-10 ENCOUNTER — Telehealth: Payer: Self-pay

## 2023-11-10 DIAGNOSIS — R0602 Shortness of breath: Secondary | ICD-10-CM

## 2023-11-10 DIAGNOSIS — E7800 Pure hypercholesterolemia, unspecified: Secondary | ICD-10-CM

## 2023-11-10 DIAGNOSIS — Z79899 Other long term (current) drug therapy: Secondary | ICD-10-CM

## 2023-11-10 DIAGNOSIS — I272 Pulmonary hypertension, unspecified: Secondary | ICD-10-CM

## 2023-11-10 DIAGNOSIS — I2510 Atherosclerotic heart disease of native coronary artery without angina pectoris: Secondary | ICD-10-CM

## 2023-11-10 DIAGNOSIS — I10 Essential (primary) hypertension: Secondary | ICD-10-CM

## 2023-11-10 DIAGNOSIS — R002 Palpitations: Secondary | ICD-10-CM

## 2023-11-10 NOTE — Telephone Encounter (Signed)
 Patient called back. Informed her of results. Will place order and send results to patient's pulmonologist.

## 2023-11-10 NOTE — Telephone Encounter (Signed)
-----   Message from Wilbert Bihari sent at 11/07/2023  5:55 PM EDT ----- Echo showed normal heart function with moderate pulmonary HTN, mildly leaky MV.  I would like her to have PFTs with DLCO to rule out COPD as a cause of pulmonary HTN.  ALso please get VQ scan to rule out chronic PEs.  Check CRP, ESR, ANA and RF.  Please get an itamar HST to rule out OSA.  Repeat echo in 1 year for pulmonary HTN

## 2023-11-10 NOTE — Telephone Encounter (Signed)
 Left message for patient to call back

## 2023-11-17 DIAGNOSIS — R0602 Shortness of breath: Secondary | ICD-10-CM | POA: Diagnosis not present

## 2023-11-17 DIAGNOSIS — Z79899 Other long term (current) drug therapy: Secondary | ICD-10-CM | POA: Diagnosis not present

## 2023-11-17 DIAGNOSIS — I272 Pulmonary hypertension, unspecified: Secondary | ICD-10-CM | POA: Diagnosis not present

## 2023-11-17 DIAGNOSIS — E7800 Pure hypercholesterolemia, unspecified: Secondary | ICD-10-CM | POA: Diagnosis not present

## 2023-11-17 DIAGNOSIS — R002 Palpitations: Secondary | ICD-10-CM | POA: Diagnosis not present

## 2023-11-17 DIAGNOSIS — I2510 Atherosclerotic heart disease of native coronary artery without angina pectoris: Secondary | ICD-10-CM | POA: Diagnosis not present

## 2023-11-17 DIAGNOSIS — I10 Essential (primary) hypertension: Secondary | ICD-10-CM | POA: Diagnosis not present

## 2023-11-18 LAB — RHEUMATOID FACTOR: Rheumatoid fact SerPl-aCnc: 11 [IU]/mL (ref <14.0)

## 2023-11-18 LAB — C-REACTIVE PROTEIN: CRP: 6 mg/L (ref 0–10)

## 2023-11-18 LAB — SEDIMENTATION RATE: Sed Rate: 5 mm/h (ref 0–40)

## 2023-11-18 LAB — ANA: Anti Nuclear Antibody (ANA): NEGATIVE

## 2023-11-22 ENCOUNTER — Other Ambulatory Visit: Payer: Self-pay

## 2023-11-22 ENCOUNTER — Ambulatory Visit (HOSPITAL_COMMUNITY)
Admission: RE | Admit: 2023-11-22 | Discharge: 2023-11-22 | Disposition: A | Discharge status: Home / Self Care | Source: Ambulatory Visit | Attending: Cardiology | Admitting: Cardiology

## 2023-11-22 ENCOUNTER — Emergency Department (HOSPITAL_BASED_OUTPATIENT_CLINIC_OR_DEPARTMENT_OTHER)
Admission: EM | Admit: 2023-11-22 | Discharge: 2023-11-22 | Disposition: A | Discharge status: Home / Self Care | Attending: Emergency Medicine | Admitting: Emergency Medicine

## 2023-11-22 ENCOUNTER — Encounter (HOSPITAL_COMMUNITY)
Admission: RE | Admit: 2023-11-22 | Discharge: 2023-11-22 | Disposition: A | Discharge status: Home / Self Care | Source: Ambulatory Visit | Attending: Cardiology | Admitting: Cardiology

## 2023-11-22 ENCOUNTER — Telehealth: Payer: Self-pay | Admitting: Cardiology

## 2023-11-22 ENCOUNTER — Emergency Department (HOSPITAL_BASED_OUTPATIENT_CLINIC_OR_DEPARTMENT_OTHER)

## 2023-11-22 DIAGNOSIS — R0602 Shortness of breath: Secondary | ICD-10-CM | POA: Diagnosis not present

## 2023-11-22 DIAGNOSIS — Q676 Pectus excavatum: Secondary | ICD-10-CM | POA: Diagnosis not present

## 2023-11-22 DIAGNOSIS — R590 Localized enlarged lymph nodes: Secondary | ICD-10-CM | POA: Diagnosis not present

## 2023-11-22 DIAGNOSIS — I2782 Chronic pulmonary embolism: Secondary | ICD-10-CM | POA: Diagnosis not present

## 2023-11-22 DIAGNOSIS — Z79899 Other long term (current) drug therapy: Secondary | ICD-10-CM

## 2023-11-22 DIAGNOSIS — Z7982 Long term (current) use of aspirin: Secondary | ICD-10-CM | POA: Diagnosis not present

## 2023-11-22 DIAGNOSIS — J219 Acute bronchiolitis, unspecified: Secondary | ICD-10-CM | POA: Diagnosis not present

## 2023-11-22 DIAGNOSIS — R002 Palpitations: Secondary | ICD-10-CM | POA: Diagnosis not present

## 2023-11-22 DIAGNOSIS — I2510 Atherosclerotic heart disease of native coronary artery without angina pectoris: Secondary | ICD-10-CM

## 2023-11-22 DIAGNOSIS — E7800 Pure hypercholesterolemia, unspecified: Secondary | ICD-10-CM

## 2023-11-22 DIAGNOSIS — I272 Pulmonary hypertension, unspecified: Secondary | ICD-10-CM

## 2023-11-22 DIAGNOSIS — Z853 Personal history of malignant neoplasm of breast: Secondary | ICD-10-CM | POA: Insufficient documentation

## 2023-11-22 DIAGNOSIS — I700 Atherosclerosis of aorta: Secondary | ICD-10-CM | POA: Diagnosis not present

## 2023-11-22 DIAGNOSIS — I10 Essential (primary) hypertension: Secondary | ICD-10-CM

## 2023-11-22 DIAGNOSIS — M7989 Other specified soft tissue disorders: Secondary | ICD-10-CM | POA: Diagnosis not present

## 2023-11-22 LAB — CBC
HCT: 34.5 % — ABNORMAL LOW (ref 36.0–46.0)
Hemoglobin: 11.5 g/dL — ABNORMAL LOW (ref 12.0–15.0)
MCH: 28.3 pg (ref 26.0–34.0)
MCHC: 33.3 g/dL (ref 30.0–36.0)
MCV: 85 fL (ref 80.0–100.0)
Platelets: 253 10*3/uL (ref 150–400)
RBC: 4.06 MIL/uL (ref 3.87–5.11)
RDW: 16.7 % — ABNORMAL HIGH (ref 11.5–15.5)
WBC: 9.7 10*3/uL (ref 4.0–10.5)
nRBC: 0 % (ref 0.0–0.2)

## 2023-11-22 LAB — BASIC METABOLIC PANEL WITH GFR
Anion gap: 12 (ref 5–15)
BUN: 25 mg/dL — ABNORMAL HIGH (ref 8–23)
CO2: 23 mmol/L (ref 22–32)
Calcium: 9.7 mg/dL (ref 8.9–10.3)
Chloride: 103 mmol/L (ref 98–111)
Creatinine, Ser: 0.88 mg/dL (ref 0.44–1.00)
GFR, Estimated: >60 mL/min (ref >60)
Glucose, Bld: 90 mg/dL (ref 70–99)
Potassium: 4.1 mmol/L (ref 3.5–5.1)
Sodium: 137 mmol/L (ref 135–145)

## 2023-11-22 LAB — TROPONIN T, HIGH SENSITIVITY: Troponin T High Sensitivity: <15 ng/L (ref <19)

## 2023-11-22 MED ORDER — IOHEXOL 350 MG/ML SOLN
100.0000 mL | Freq: Once | INTRAVENOUS | Status: AC | PRN
  Administered 2023-11-22: 100 mL via INTRAVENOUS

## 2023-11-22 MED ORDER — AZITHROMYCIN 250 MG PO TABS: 250.0000 mg | ORAL_TABLET | Freq: Every day | ORAL | 0 refills | Status: AC

## 2023-11-22 MED ORDER — AMOXICILLIN 500 MG PO CAPS: 500.0000 mg | ORAL_CAPSULE | Freq: Three times a day (TID) | ORAL | 0 refills | Status: AC

## 2023-11-22 MED ORDER — TECHNETIUM TO 99M ALBUMIN AGGREGATED
4.2000 | Freq: Once | INTRAVENOUS | Status: AC | PRN
  Administered 2023-11-22: 4.2 via INTRAVENOUS

## 2023-11-22 NOTE — ED Provider Notes (Signed)
 [place] EMERGENCY DEPARTMENT AT [facility] Provider Note   CSN: [id number] Arrival date & time: 11/22/23  1719     History  No chief complaint on file.   Tammy Holland is a 83 y.o. female with past medical history significant for hypertension, hyperlipidemia, CAD, previous breast cancer, not currently undergoing treatment who presents with concern for shortness of breath for 6 months with some fatigue, no chest pain.  She did have an ankle injury on the left several weeks ago and has been having some mild swelling of the foot and ankle, has been wearing a brace.  Given her chronic symptoms she was seen outpatient for a plain film chest x-ray as well as a perfusion study, perfusion study revealed findings consistent with possible multiple chronic subsegmental PE, and was sent to the ER for further evaluation.  HPI     Home Medications Prior to Admission medications   Medication Sig Start Date End Date Taking? Authorizing Provider  amoxicillin (AMOXIL) 500 MG capsule Take 1 capsule (500 mg total) by mouth 3 (three) times daily. 11/22/23  Yes Hyla Coard H, PA-C  azithromycin (ZITHROMAX) 250 MG tablet Take 1 tablet (250 mg total) by mouth daily. Take first 2 tablets together, then 1 every day until finished. 11/22/23  Yes Lizvet Chunn H, PA-C  albuterol  (PROVENTIL  HFA;VENTOLIN  HFA) 108 (90 Base) MCG/ACT inhaler Inhale 2 puffs into the lungs every 4 (four) hours as needed for wheezing or shortness of breath.    [provider]  Artificial Tear Solution (SOOTHE XP) SOLN Place 1 drop into both eyes daily as needed (dry eyes).    [provider]  aspirin  EC 81 MG tablet Take 81 mg by mouth daily. 12/10/19   [provider]  atorvastatin  (LIPITOR) 40 MG tablet Take 1 tablet (40 mg total) by mouth daily. 01/19/22   Jacqueline Matsu, MD  bisacodyl  (DULCOLAX) 5 MG EC tablet Take 5 mg by mouth daily as needed for moderate constipation.    [provider]  fexofenadine (ALLEGRA) 180 MG tablet Take 180 mg by mouth daily.    [provider]  fluticasone  (FLONASE ) 50 MCG/ACT nasal spray Place 1-2 sprays into both nostrils daily as needed for allergies.    [provider]  hydrochlorothiazide  (MICROZIDE ) 12.5 MG capsule Take 12.5 mg by mouth daily.  06/25/11   [provider]  metoprolol  succinate (TOPROL -XL) 25 MG 24 hr tablet Take 25 mg by mouth daily. 09/27/23   [provider]  Misc Natural Products (OSTEO BI-FLEX TRIPLE STRENGTH) TABS Take 2 tablets by mouth daily.    [provider]  Multiple Vitamin (MULTIVITAMIN) capsule Take 1 capsule by mouth daily.    [provider]  Omega-3 Fatty Acids (FISH OIL) 1200 MG CAPS Take 1 capsule by mouth daily.    [provider]  OVER THE COUNTER MEDICATION Biotin and collagen liquid    [provider]  oxymetazoline (AFRIN) 0.05 % nasal spray Place 1 spray into both nostrils 2 (two) times daily as needed for congestion.    [provider]  polyethylene glycol (MIRALAX  / GLYCOLAX ) 17 g packet Take 17 g by mouth daily as needed for moderate constipation.    [provider]  sodium chloride  (OCEAN) 0.65 % SOLN nasal spray Place 1-2 sprays into both nostrils 3 (three) times daily as needed for congestion.    [provider]  Zachery Hermes INHUB 100-50 MCG/ACT AEPB Inhale 1 puff into the lungs 2 (  two) times daily. 05/24/23   [provider]      Allergies    Turmeric    Review of Systems   Review of Systems  All other systems reviewed and are negative.   Physical Exam Updated Vital Signs BP (!) 147/65   Pulse 64   Temp 98 F (36.7 C) (Oral)   Resp 20   SpO2 96%  Physical Exam Vitals and nursing note reviewed.  Constitutional:      General: She is not in acute distress.    Appearance: Normal appearance.  HENT:     Head: Normocephalic and atraumatic.  Eyes:     General:        Right  eye: No discharge.        Left eye: No discharge.  Cardiovascular:     Rate and Rhythm: Normal rate and regular rhythm.     Heart sounds: No murmur heard.    No friction rub. No gallop.  Pulmonary:     Effort: Pulmonary effort is normal.     Breath sounds: Normal breath sounds.     Comments: No wheezing, rhonchi, stridor, rales Abdominal:     General: Bowel sounds are normal.     Palpations: Abdomen is soft.  Musculoskeletal:     Comments: No significant bilateral lower extremity swelling on exam  Skin:    General: Skin is warm and dry.     Capillary Refill: Capillary refill takes less than 2 seconds.  Neurological:     Mental Status: She is alert and oriented to person, place, and time.  Psychiatric:        Mood and Affect: Mood normal.        Behavior: Behavior normal.     ED Results / Procedures / Treatments   Labs (all labs ordered are listed, but only abnormal results are displayed) Labs Reviewed  CBC - Abnormal; Notable for the following components:      Result Value   Hemoglobin 11.5 (*)    HCT 34.5 (*)    RDW 16.7 (*)    All other components within normal limits  BASIC METABOLIC PANEL WITH GFR - Abnormal; Notable for the following components:   BUN 25 (*)    All other components within normal limits  TROPONIN T, HIGH SENSITIVITY    EKG None  Radiology DG Chest 2 View Result Date: 11/22/2023 CLINICAL DATA:  Pulmonary hypertension EXAM: CHEST - 2 VIEW COMPARISON:  06/09/2012 FINDINGS: Faint interstitial accentuation bilaterally. Atherosclerotic calcification of the aortic arch. No pleural effusion or discrete consolidation. Mild thoracic spondylosis. IMPRESSION: 1. Faint interstitial accentuation bilaterally, without frank edema or consolidation. 2. Atherosclerotic calcification of the aortic arch. 3. Mild thoracic spondylosis. Electronically Signed   By: Freida Jes M.D.   On: 11/22/2023 19:53   CT Angio Chest PE W and/or Wo Contrast Result Date:  11/22/2023 CLINICAL DATA:  Shortness of breath and fatigue, suspected sequela of chronic pulmonary emboli on perfusion lung scan. EXAM: CT ANGIOGRAPHY CHEST WITH CONTRAST TECHNIQUE: Multidetector CT imaging of the chest was performed using the standard protocol during bolus administration of intravenous contrast. Multiplanar CT image reconstructions and MIPs were obtained to evaluate the vascular anatomy. RADIATION DOSE REDUCTION: This exam was performed according to the departmental dose-optimization program which includes automated exposure control, adjustment of the mA and/or kV according to patient size and/or use of iterative reconstruction technique. CONTRAST:  OMNIPAQUE  IOHEXOL  350 MG/ML SOLN COMPARISON:  Perfusion lung scan 11/22/2023, cardiac CT 12/06/2019  FINDINGS: Cardiovascular: No filling defect is identified in the pulmonary arterial tree to suggest pulmonary embolus. Atheromatous calcification in the aortic arch and branch vessels. Mediastinum/Nodes: Borderline prominent right infrahilar node 1.0 cm in short axis on image 89 series four, formerly 0.9 cm on 12/06/2019. Left infrahilar node 0.7 cm in short axis. Lungs/Pleura: Tree-in-bud reticulonodular opacities scattered in both lungs favoring atypical infectious bronchiolitis. Bilateral airway thickening with airway plugging noted in the right middle lobe and right lower lobe. Upper Abdomen: Abdominal aortic atherosclerosis. Musculoskeletal: Mild lower pectus excavatum related to sternal curvature, Haller index 3.1. Thoracic spondylosis noted. Review of the MIP images confirms the above findings. IMPRESSION: 1. No filling defect is identified in the pulmonary arterial tree to suggest pulmonary embolus. 2. Tree-in-bud reticulonodular opacities scattered in both lungs favoring atypical infectious bronchiolitis. 3. Bilateral airway thickening with airway plugging in the right middle lobe and right lower lobe. 4. Borderline prominent right  infrahilar node 1.0 cm in short axis, formerly 0.9 cm on 12/06/2019. Probably reactive. 5. Mild lower pectus excavatum related to sternal curvature, Haller index 3.1. 6.  Aortic Atherosclerosis (ICD10-I70.0). Electronically Signed   By: Freida Jes M.D.   On: 11/22/2023 19:52   US  Venous Img Lower  Left (DVT Study) Result Date: 11/22/2023 CLINICAL DATA:  Leg swelling, chronic pulmonary embolus EXAM: LEFT LOWER EXTREMITY VENOUS DOPPLER ULTRASOUND TECHNIQUE: Gray-scale sonography with compression, as well as color and duplex ultrasound, were performed to evaluate the deep venous system(s) from the level of the common femoral vein through the popliteal and proximal calf veins. COMPARISON:  None Available. FINDINGS: VENOUS Normal compressibility of the common femoral, superficial femoral, and popliteal veins, as well as the visualized calf veins. Visualized portions of profunda femoral vein and great saphenous vein unremarkable. No filling defects to suggest DVT on grayscale or color Doppler imaging. Doppler waveforms show normal direction of venous flow, normal respiratory plasticity and response to augmentation. Limited views of the contralateral common femoral vein are unremarkable. OTHER None. Limitations: none IMPRESSION: No findings of left lower extremity DVT. Electronically Signed   By: Freida Jes M.D.   On: 11/22/2023 18:58   NM Pulmonary Perfusion Result Date: 11/22/2023 CLINICAL DATA:  Pulmonary hypertension.  Concern for chronic PE. EXAM: NUCLEAR MEDICINE PERFUSION LUNG SCAN TECHNIQUE: Perfusion images were obtained in multiple projections after intravenous injection of radiopharmaceutical. RADIOPHARMACEUTICALS:  4.2 mCi Tc-27m MAA COMPARISON:  Chest radiograph 11/22/2023 FINDINGS: There are multiple small peripheral perfusion defects within LEFT and RIGHT lung. These small peripheral perfusion defects are not matched on comparison chest radiograph. IMPRESSION: Multiple small peripheral  perfusion defects within the bilateral lungs are concerning for sequelae chronic pulmonary emboli. These results will be called to the ordering clinician or representative by the Radiologist Assistant, and communication documented in the PACS or [employer]. Electronically Signed   By: Deboraha Fallow M.D.   On: 11/22/2023 15:02    Procedures Procedures    Medications Ordered in ED Medications  iohexol  (OMNIPAQUE ) 350 MG/ML injection 100 mL (100 mLs Intravenous Contrast Given 11/22/23 1919)    ED Course/ Medical Decision Making/ A&P                                 Medical Decision Making Amount and/or Complexity of Data Reviewed Labs: ordered. Radiology: ordered.   This patient is a 83 y.o. female  who presents to the ED for concern of shortness of breath, possible new  PE.   Differential diagnoses prior to evaluation: The emergent differential diagnosis includes, but is not limited to,  asthma exacerbation, COPD exacerbation, acute upper respiratory infection, acute bronchitis, chronic bronchitis, interstitial lung disease, ARDS, PE, pneumonia, atypical ACS, carbon monoxide poisoning, spontaneous pneumothorax, new CHF vs CHF exacerbation, versus other --certainly with high suspicion for chronic versus acute pulmonary embolus given the perfusion study just prior to arrival. This is not an exhaustive differential.   Past Medical History / Co-morbidities / Social History:  hypertension, hyperlipidemia, CAD, previous breast cancer, not currently undergoing treatment  Additional history: Chart reviewed. Pertinent results include: Reviewed her lab work, imaging performed outpatient, revision study concerning for chronic pulmonary embolism  Physical Exam: Physical exam performed. The pertinent findings include: She is hypertensive in the emergency department, blood pressure 172/68, otherwise her vital signs are stable, notably with no tachycardia, no hypoxia.  She has clear lung  sounds throughout on my exam.  Lab Tests/Imaging studies: I personally interpreted labs/imaging and the pertinent results include: CBC with mild anemia, hemoglobin 11.5, BMP overall unremarkable other than mildly elevated BUN at 25, Notably with normal creatinine.  Initial troponin is negative at less than 15, do not think that repeat troponin is necessary with 6 months of shortness of breath and no chest pain today.  DVT study is negative, CT PE study shows some mucous plugging and findings consistent with bronchiolitis, no evidence of PE, given her age I think reasonable to treat with antibiotics to cover for a bacterial infectious process, and encourage close PCP follow-up. I agree with the radiologist interpretation.  Cardiac monitoring: EKG obtained and interpreted by myself and attending physician which shows: Normal sinus rhythm, no acute ST-T changes   Medications: I ordered medication including we will plan to discharge with amoxicillin, azithromycin to cover for bronchiolitis, otherwise encourage close PCP follow-up, will forward note to her PCP showing no blood clot in the lungs.  I have reviewed the patients home medicines and have made adjustments as needed.   Disposition: After consideration of the diagnostic results and the patients response to treatment, I feel that patient stable for discharge with plan as above.   emergency department workup does not suggest an emergent condition requiring admission or immediate intervention beyond what has been performed at this time. The plan is: as above. The patient is safe for discharge and has been instructed to return immediately for worsening symptoms, change in symptoms or any other concerns.  Final Clinical Impression(s) / ED Diagnoses Final diagnoses:  Bronchiolitis    Rx / DC Orders ED Discharge Orders          Ordered    amoxicillin (AMOXIL) 500 MG capsule  3 times daily        11/22/23 2000    azithromycin (ZITHROMAX) 250 MG  tablet  Daily        11/22/23 2000              Isidor Bromell H, PA-C 11/22/23 2000    Tegeler, Marine Sia, MD 11/22/23 2158

## 2023-11-22 NOTE — Telephone Encounter (Signed)
 Spoke with pt and advised of Dr Charl Concha recommendation to seek further evaluation in the ED for possible pulmonary emboli.  Pt verbalizes understanding and is agreeable with current plan.

## 2023-11-22 NOTE — ED Notes (Signed)
 Patient transported to CT

## 2023-11-22 NOTE — ED Triage Notes (Addendum)
 Perfusion study found to have pulmonary emboli. Ongoing SOB 6 months and fatigue. ASA 81 daily. Sprained ankle left several weeks ago- had been wearing a brace for it. Reports lingering pain in this foot.

## 2023-11-22 NOTE — Telephone Encounter (Signed)
 Spoke with Diane who is calling to report critical results for NM Pulmonary Perfusion Lung Scan.  Impression shows  IMPRESSION: Multiple small peripheral perfusion defects within the bilateral lungs are concerning for sequelae chronic pulmonary emboli.  Spoke with Dr Arlester Ladd, DOD who reviewed report and states nothing to do acutely due to chronic pulmonary emboli and forward to Dr Micael Adas for her review and further recommendation.

## 2023-11-22 NOTE — Discharge Instructions (Addendum)
 Please take the entire course of antibiotics that I have prescribed unless otherwise directed by your primary care doctor and follow-up closely to discuss your symptoms if you continue to have shortness of breath.

## 2023-11-22 NOTE — Telephone Encounter (Signed)
 Caller (Diane) is reporting abnormal results.

## 2023-11-23 ENCOUNTER — Other Ambulatory Visit: Payer: Self-pay

## 2023-11-23 ENCOUNTER — Telehealth: Payer: Self-pay | Admitting: Emergency Medicine

## 2023-11-23 DIAGNOSIS — I272 Pulmonary hypertension, unspecified: Secondary | ICD-10-CM

## 2023-11-23 MED ORDER — APIXABAN 2.5 MG PO TABS: 2.5000 mg | ORAL_TABLET | Freq: Two times a day (BID) | ORAL | 11 refills | Status: AC

## 2023-11-23 NOTE — Telephone Encounter (Signed)
 Next avail new pt slot with MH is 7/14. Pt is currently scheduled with Dr. Waylan Haggard 6/11 for Asthma. Dr. Baldwin Levee please advise what you would like me to do regarding scheduling pt

## 2023-11-23 NOTE — Telephone Encounter (Signed)
 Can we set this pt up as a new consult visit for Dr [facility] for pulmonary hypertension

## 2023-11-23 NOTE — Telephone Encounter (Signed)
 Keep the PM appt. He can discuss w MH and decide whether she should see MH for PAH  FYI to PM and MH

## 2023-11-23 NOTE — Telephone Encounter (Signed)
 Routing to Sharon to call pt and let her know of plans

## 2023-11-24 ENCOUNTER — Telehealth: Payer: Self-pay

## 2023-11-24 DIAGNOSIS — Z96659 Presence of unspecified artificial knee joint: Secondary | ICD-10-CM | POA: Diagnosis not present

## 2023-11-24 DIAGNOSIS — I2782 Chronic pulmonary embolism: Secondary | ICD-10-CM | POA: Diagnosis not present

## 2023-11-24 DIAGNOSIS — Z853 Personal history of malignant neoplasm of breast: Secondary | ICD-10-CM | POA: Diagnosis not present

## 2023-11-24 DIAGNOSIS — R0600 Dyspnea, unspecified: Secondary | ICD-10-CM | POA: Diagnosis not present

## 2023-11-24 MED ORDER — APIXABAN 2.5 MG PO TABS: 2.5000 mg | ORAL_TABLET | Freq: Two times a day (BID) | ORAL | 0 refills | Status: AC

## 2023-11-24 NOTE — Telephone Encounter (Signed)
 Spoke with pt regarding a new start of Eliquis . 3 weeks of samples were given and left in the sample cabinet for pick up. Pt verbalized understanding. All questions if any were answered.

## 2023-11-24 NOTE — Telephone Encounter (Signed)
-----   Message from Gaylyn Keas sent at 11/22/2023  8:07 PM EDT ----- Cxray showed aortic plaque in the aortic arch

## 2023-11-27 NOTE — Telephone Encounter (Signed)
 Pt aware NFN

## 2023-12-07 NOTE — ED Provider Notes (Signed)
 EKG interpretation from visit on 4/30 with NSR, no acute ST-T changes at that time. Not linked appropriately but in chart at 4/30 under default provider.   "Sinus rhythm Right atrial enlargement Probable lateral infarct, old when compared to prior, more wandering baseline No STEMI Confirmed by Wynell Heath ([id number]) on 11/22/2023 7:56:30 PM"   Nelly Banco, PA-C 12/07/23 1408    Tegeler, Marine Sia, MD 12/07/23 [phone]

## 2023-12-14 ENCOUNTER — Encounter (HOSPITAL_COMMUNITY): Payer: Self-pay

## 2023-12-19 ENCOUNTER — Ambulatory Visit: Payer: Self-pay | Admitting: Cardiology

## 2023-12-19 ENCOUNTER — Encounter (HOSPITAL_COMMUNITY)
Admission: RE | Admit: 2023-12-19 | Discharge: 2023-12-19 | Disposition: A | Discharge status: Home / Self Care | Source: Ambulatory Visit | Attending: Cardiology | Admitting: Cardiology

## 2023-12-19 DIAGNOSIS — I2510 Atherosclerotic heart disease of native coronary artery without angina pectoris: Secondary | ICD-10-CM | POA: Insufficient documentation

## 2023-12-19 DIAGNOSIS — R0602 Shortness of breath: Secondary | ICD-10-CM | POA: Insufficient documentation

## 2023-12-19 LAB — NM PET CT CARDIAC PERFUSION MULTI W/ABSOLUTE BLOODFLOW
LV dias vol: 68 mL (ref 46–106)
LV sys vol: 22 mL
MBFR: 2.82
Nuc Rest EF: 68 %
Nuc Stress EF: 75 %
Rest MBF: 0.84 ml/g/min
Rest Nuclear Isotope Dose: 15.5 mCi
ST Depression (mm): 0 mm
Stress MBF: 2.37 ml/g/min
Stress Nuclear Isotope Dose: 15.3 mCi

## 2023-12-19 MED ORDER — REGADENOSON 0.4 MG/5ML IV SOLN
INTRAVENOUS | Status: AC
  Filled 2023-12-19: qty 5

## 2023-12-19 MED ORDER — RUBIDIUM RB82 GENERATOR (RUBYFILL)
15.3200 | PACK | Freq: Once | INTRAVENOUS | Status: AC
Start: 2023-12-19 — End: 2023-12-19
  Administered 2023-12-19: 15.32 via INTRAVENOUS

## 2023-12-19 MED ORDER — RUBIDIUM RB82 GENERATOR (RUBYFILL)
15.4600 | PACK | Freq: Once | INTRAVENOUS | Status: AC
  Administered 2023-12-19: 15.46 via INTRAVENOUS

## 2023-12-19 MED ORDER — REGADENOSON 0.4 MG/5ML IV SOLN
0.4000 mg | Freq: Once | INTRAVENOUS | Status: AC
  Administered 2023-12-19: 0.4 mg via INTRAVENOUS

## 2023-12-19 NOTE — Telephone Encounter (Signed)
 Call to patient to advise that Cardiac portion of stress PET/CT showed no ischemia and normal LV function EF 75%. However, CT showed chronic bronchiectasis. Patient states she has a new patient appt w/ Dr. March Service at [facility] pulmonology on 01/03/24. Advised patient to keep that appt and that would forward to patient's PCP for further evaluation and treatment.

## 2023-12-20 ENCOUNTER — Encounter: Payer: Self-pay | Admitting: Cardiology

## 2023-12-20 DIAGNOSIS — I10 Essential (primary) hypertension: Secondary | ICD-10-CM | POA: Diagnosis not present

## 2023-12-20 DIAGNOSIS — M19049 Primary osteoarthritis, unspecified hand: Secondary | ICD-10-CM | POA: Diagnosis not present

## 2023-12-20 DIAGNOSIS — Z800 Family history of malignant neoplasm of digestive organs: Secondary | ICD-10-CM | POA: Diagnosis not present

## 2023-12-20 DIAGNOSIS — J479 Bronchiectasis, uncomplicated: Secondary | ICD-10-CM | POA: Diagnosis not present

## 2023-12-20 DIAGNOSIS — Z0184 Encounter for antibody response examination: Secondary | ICD-10-CM | POA: Diagnosis not present

## 2023-12-20 DIAGNOSIS — Z853 Personal history of malignant neoplasm of breast: Secondary | ICD-10-CM | POA: Diagnosis not present

## 2023-12-20 DIAGNOSIS — K219 Gastro-esophageal reflux disease without esophagitis: Secondary | ICD-10-CM | POA: Diagnosis not present

## 2023-12-20 DIAGNOSIS — I2510 Atherosclerotic heart disease of native coronary artery without angina pectoris: Secondary | ICD-10-CM | POA: Diagnosis not present

## 2023-12-20 DIAGNOSIS — J45909 Unspecified asthma, uncomplicated: Secondary | ICD-10-CM | POA: Diagnosis not present

## 2023-12-20 DIAGNOSIS — J301 Allergic rhinitis due to pollen: Secondary | ICD-10-CM | POA: Diagnosis not present

## 2023-12-20 DIAGNOSIS — J4521 Mild intermittent asthma with (acute) exacerbation: Secondary | ICD-10-CM | POA: Diagnosis not present

## 2023-12-20 DIAGNOSIS — D126 Benign neoplasm of colon, unspecified: Secondary | ICD-10-CM | POA: Diagnosis not present

## 2023-12-20 DIAGNOSIS — M47812 Spondylosis without myelopathy or radiculopathy, cervical region: Secondary | ICD-10-CM | POA: Diagnosis not present

## 2024-01-03 ENCOUNTER — Ambulatory Visit (INDEPENDENT_AMBULATORY_CARE_PROVIDER_SITE_OTHER): Admitting: Pulmonary Disease

## 2024-01-03 ENCOUNTER — Encounter: Payer: Self-pay | Admitting: Pulmonary Disease

## 2024-01-03 VITALS — BP 122/84 | HR 76 | Ht 63.0 in | Wt 133.0 lb

## 2024-01-03 DIAGNOSIS — I2729 Other secondary pulmonary hypertension: Secondary | ICD-10-CM | POA: Diagnosis not present

## 2024-01-03 DIAGNOSIS — Z87891 Personal history of nicotine dependence: Secondary | ICD-10-CM | POA: Diagnosis not present

## 2024-01-03 NOTE — Progress Notes (Signed)
 Tammy Holland    [id number]    04/20/1941  Primary Care Physician:Varadarajan, Willena Harp, MD  Referring Physician: Arva Lathe, MD [address] [place],  [place] [address]  Chief complaint: Consult for asthma, chronic pulmonary thromboembolism, pulmonary hypertension  HPI: 83 y.o. who  has a past medical history of Arthritis, Asthma, Breast cancer (HCC), CAD (coronary artery disease), native coronary artery, Cancer (HCC) (2009), Clinically significant macular edema with cotton wool spots, Cold (within last month), Dyspnea, Hearing loss, Hemorrhoids, HLD (hyperlipidemia), Hypertension, and Personal history of radiation therapy.  Discussed the use of AI scribe software for clinical note transcription with the patient, who gave verbal consent to proceed.  History of Present Illness Tammy Holland is an 83 year old female with pulmonary hypertension and chronic thromboembolic disease who presents for evaluation of her lung condition. She was referred by her cardiologist, Dr. Micael Adas, for evaluation of pulmonary hypertension and chronic thromboembolic disease.  She has a history of pulmonary hypertension diagnosed via echocardiogram in April, which led to a VQ scan showing chronic thromboembolism. A subsequent CT angiogram did not show large blood clots. She is currently on Eliquis .  ANA, rheumatoid factor, sed rate, CRP are normal.  Home sleep study and PFTs have already been ordered and are waiting for scheduling  She has a history of asthma for approximately ten years, with worsening symptoms over the past six months. She experiences increased shortness of breath, limiting her physical activities such as walking and gardening. She uses Wixela and albuterol  inhalers, with albuterol  being used up to three times a day, especially in hot, humid weather. Previously, she was on Symbicort for about two years before switching to [place] due to insurance changes.  She  has undergone a lower extremity ultrasound in the emergency room due to a foot sprain. She has a history of vein surgery and knee replacements, but recently her legs have been painful, which is a new symptom.  Pets: Has no current pets, though she occasionally sees her grandchildren's dogs. Occupation: Used to work as a Haematologist Exposures:She resides in a retirement community and has no recent exposure to mold, hot tubs, or feather bedding. No h/o chemo/XRT/amiodarone/macrodantin/MTX  No exposure to asbestos, silica or other organic allergens  Smoking history:She smoked occasionally until the late 1970s, averaging less than two cigarettes a week. Travel history: Originally from [place] .  No significant recent travel Relevant family history:Her family history is significant for lung disease, with her mother and aunt both having bronchiectasis and emphysema, despite no exposure to cigarette smoke.   Outpatient Encounter Medications as of 01/03/2024  Medication Sig   albuterol  (PROVENTIL  HFA;VENTOLIN  HFA) 108 (90 Base) MCG/ACT inhaler Inhale 2 puffs into the lungs every 4 (four) hours as needed for wheezing or shortness of breath.   amLODipine (NORVASC) 5 MG tablet 1 tablet Orally Once a day for 30 days   amoxicillin  (AMOXIL ) 500 MG capsule Take 1 capsule (500 mg total) by mouth 3 (three) times daily.   apixaban  (ELIQUIS ) 2.5 MG TABS tablet Take 1 tablet (2.5 mg total) by mouth 2 (two) times daily.   apixaban  (ELIQUIS ) 2.5 MG TABS tablet Take 1 tablet (2.5 mg total) by mouth 2 (two) times daily.   Artificial Tear Solution (SOOTHE XP) SOLN Place 1 drop into both eyes daily as needed (dry eyes).   aspirin  EC 81 MG tablet Take 81 mg by mouth daily.   atorvastatin  (LIPITOR) 40  MG tablet Take 1 tablet (40 mg total) by mouth daily.   azithromycin  (ZITHROMAX ) 250 MG tablet Take 1 tablet (250 mg total) by mouth daily. Take first 2 tablets together, then 1 every day until finished.   bisacodyl   (DULCOLAX) 5 MG EC tablet Take 5 mg by mouth daily as needed for moderate constipation.   fexofenadine (ALLEGRA) 180 MG tablet Take 180 mg by mouth daily.   fluticasone  (FLONASE ) 50 MCG/ACT nasal spray Place 1-2 sprays into both nostrils daily as needed for allergies.   hydrochlorothiazide  (MICROZIDE ) 12.5 MG capsule Take 12.5 mg by mouth daily.    Misc Natural Products (OSTEO BI-FLEX TRIPLE STRENGTH) TABS Take 2 tablets by mouth daily.   Multiple Vitamin (MULTIVITAMIN) capsule Take 1 capsule by mouth daily.   Omega-3 Fatty Acids (FISH OIL) 1200 MG CAPS Take 1 capsule by mouth daily.   OVER THE COUNTER MEDICATION Biotin and collagen liquid   oxymetazoline (AFRIN) 0.05 % nasal spray Place 1 spray into both nostrils 2 (two) times daily as needed for congestion.   polyethylene glycol (MIRALAX  / GLYCOLAX ) 17 g packet Take 17 g by mouth daily as needed for moderate constipation.   sodium chloride  (OCEAN) 0.65 % SOLN nasal spray Place 1-2 sprays into both nostrils 3 (three) times daily as needed for congestion.   WIXELA INHUB 100-50 MCG/ACT AEPB Inhale 1 puff into the lungs 2 (two) times daily.   metoprolol  succinate (TOPROL -XL) 25 MG 24 hr tablet Take 25 mg by mouth daily. (Patient not taking: Reported on 01/03/2024)   No facility-administered encounter medications on file as of 01/03/2024.    Allergies as of 01/03/2024 - Review Complete 01/03/2024  Allergen Reaction Noted   Turmeric Other (See Comments) 08/06/2020    Past Medical History:  Diagnosis Date   Arthritis    oa   Asthma    Breast cancer (HCC)    CAD (coronary artery disease), native coronary artery    coronary CTA 2021 with 25-49% mLAD stenosis and normal FFR   Cancer (HCC) 2009   BREAST left partial mastectomy and radiation   Clinically significant macular edema with cotton wool spots    takes preservision for   Cold within last month   spitting up clear mucous now, no fevers   Dyspnea    with exertion only   Hearing loss     both ears   Hemorrhoids    HLD (hyperlipidemia)    LDL goal < 70   Hypertension    Personal history of radiation therapy     Past Surgical History:  Procedure Laterality Date   BREAST LUMPECTOMY     colonscopy     TONSILLECTOMY AND ADENOIDECTOMY  age 20   TOTAL KNEE ARTHROPLASTY Left 07/04/2016   Procedure: LEFT TOTAL KNEE ARTHROPLASTY;  Surgeon: Liliane Rei, MD;  Location: [facility];  Service: Orthopedics;  Laterality: Left;   TOTAL KNEE ARTHROPLASTY Right 09/14/2020   Procedure: TOTAL KNEE ARTHROPLASTY;  Surgeon: Liliane Rei, MD;  Location: [facility];  Service: Orthopedics;  Laterality: Right;    varicose vein saline injections      Family History  Problem Relation Age of Onset   Emphysema Mother    Cancer Father        bladder   Hepatitis Father    Hypertension Sister    Colon cancer Sister    Coronary artery disease Brother        prostate, skin   Alcohol  abuse Brother    Depression Brother  Stroke Maternal Grandmother    Breast cancer Cousin     Social History   Socioeconomic History   Marital status: Married    Spouse name: Not on file   Number of children: Not on file   Years of education: Not on file   Highest education level: Not on file  Occupational History   Not on file  Tobacco Use   Smoking status: Former    Current packs/day: 0.00    Types: Cigarettes    Start date: 07/25/1946    Quit date: 07/25/1976    Years since quitting: 47.4   Smokeless tobacco: Never   Tobacco comments:    social smoking only  Vaping Use   Vaping status: Never Used  Substance and Sexual Activity   Alcohol  use: Yes    Alcohol /week: 2.0 standard drinks of alcohol     Types: 2 Glasses of wine per week    Comment: per day.   Drug use: No   Sexual activity: Not on file  Other Topics Concern   Not on file  Social History Narrative   Not on file   Social Drivers of Health   Financial Resource Strain: Not on file  Food Insecurity: Not on file  Transportation  Needs: Not on file  Physical Activity: Not on file  Stress: Not on file  Social Connections: Not on file  Intimate Partner Violence: Not on file    Review of systems: Review of Systems  Constitutional: Negative for fever and chills.  HENT: Negative.   Eyes: Negative for blurred vision.  Respiratory: as per HPI  Cardiovascular: Negative for chest pain and palpitations.  Gastrointestinal: Negative for vomiting, diarrhea, blood per rectum. Genitourinary: Negative for dysuria, urgency, frequency and hematuria.  Musculoskeletal: Negative for myalgias, back pain and joint pain.  Skin: Negative for itching and rash.  Neurological: Negative for dizziness, tremors, focal weakness, seizures and loss of consciousness.  Endo/Heme/Allergies: Negative for environmental allergies.  Psychiatric/Behavioral: Negative for depression, suicidal ideas and hallucinations.  All other systems reviewed and are negative.  Physical Exam: Blood pressure 122/84, pulse 76, height 5' 3 (1.6 m), weight 133 lb (60.3 kg), SpO2 97%. Gen:      No acute distress HEENT:  EOMI, sclera anicteric Neck:     No masses; no thyromegaly Lungs:    Clear to auscultation bilaterally; normal respiratory effort CV:         Regular rate and rhythm; no murmurs Abd:      + bowel sounds; soft, non-tender; no palpable masses, no distension Ext:    No edema; adequate peripheral perfusion Skin:      Warm and dry; no rash Neuro: alert and oriented x 3 Psych: normal mood and affect  Data Reviewed: Imaging: VQ scan 11/22/2023-multiple small peripheral perfusion defects in the bilateral lungs concerning for chronic thromboembolism CT angiogram 11/22/2023-no filling defects identified.  Small tree-in-bud reticulonodular opacity, bilateral airway thickening with mucous plugging I have reviewed the images personally.  Lower extremity duplex 11/22/2023-no DVT in left lower extremity  PFTs:  Labs: Hepatic panel 09/20/2023-normal ANA,  rheumatoid factor 11/17/2023-normal Sed rate, CRP 11/17/2023-normal  Cardiac: Echocardiogram 11/07/2023-LVEF 60 to 65%, normal RV systolic function, moderately elevated PA systolic pressure with estimated RVSP of 48.4 Assessment & Plan Chronic thromboembolic pulmonary hypertension Chronic thromboembolic pulmonary hypertension confirmed by VQ scan showing perfusion defects. CT angiogram did not show large blood clots. Anticoagulation with Eliquis  is appropriate. Awaiting home sleep study and lung function test results to assess pulmonary hypertension  and potential need for right heart catheterization. Right heart catheterization, though more invasive, is low risk and will confirm presence and severity of pulmonary hypertension. Severe cases may require referral to [facility] for further management. - Continue Eliquis  for anticoagulation. - Arrange home sleep study with Dr. Charl Concha office. - Await lung function test results scheduled for the end of the month. - Discuss potential need for right heart catheterization after receiving sleep study and lung function test results.  Asthma Asthma is well-controlled with current inhaler regimen. Symptoms stable, though increased albuterol  use noted with physical activity. Patient reports having more difficulty breathing and being out of breath more often than usual in the last six months. - Continue current inhaler regimen with Wixela and albuterol  as needed. - Monitor asthma symptoms and inhaler use, especially during hot, humid weather. - Review lung function test results to assess asthma control and lung function.  Recommendations: PFTs, home sleep study Continue Wixela, albuterol  as needed for asthma  Phyllis Breeze MD [facility] 01/03/2024, 3:07 PM  CC: Arva Lathe,*

## 2024-01-03 NOTE — Patient Instructions (Signed)
 VISIT SUMMARY:  Today, you were seen for an evaluation of your lung condition, specifically your pulmonary hypertension and chronic thromboembolic disease. We discussed your history of asthma and recent symptoms, as well as your current medications and their effectiveness.  YOUR PLAN:  -CHRONIC THROMBOEMBOLIC PULMONARY HYPERTENSION: Chronic thromboembolic pulmonary hypertension is high blood pressure in the lungs' arteries due to old blood clots. You should continue taking Eliquis  to prevent further clots. We are waiting for the results of your home sleep study and lung function tests to decide if a right heart catheterization is needed. This procedure will help us  understand the severity of your condition and whether you need specialized care at [facility].  -ASTHMA: Asthma is a condition where your airways narrow and swell, making it hard to breathe. Your asthma is currently well-controlled with your inhalers, but you have noticed increased use of albuterol . Continue using Wixela and albuterol  as needed, and monitor your symptoms, especially in hot, humid weather. We will review your lung function test results to ensure your asthma is well-managed.  INSTRUCTIONS:  Please continue taking Eliquis  as prescribed. Arrange your home sleep study with Doctor Turner's office and await the results of your lung function test scheduled for the end of the month. We will discuss the potential need for a right heart catheterization after receiving these results. Continue using your inhalers as directed and monitor your asthma symptoms, particularly during hot, humid weather.

## 2024-01-08 ENCOUNTER — Encounter: Payer: Self-pay | Admitting: Cardiology

## 2024-01-16 ENCOUNTER — Ambulatory Visit (HOSPITAL_COMMUNITY)
Admission: RE | Admit: 2024-01-16 | Discharge: 2024-01-16 | Disposition: A | Discharge status: Home / Self Care | Source: Ambulatory Visit | Attending: Cardiology | Admitting: Cardiology

## 2024-01-16 DIAGNOSIS — E7800 Pure hypercholesterolemia, unspecified: Secondary | ICD-10-CM | POA: Diagnosis not present

## 2024-01-16 DIAGNOSIS — I2510 Atherosclerotic heart disease of native coronary artery without angina pectoris: Secondary | ICD-10-CM | POA: Diagnosis not present

## 2024-01-16 DIAGNOSIS — Z79899 Other long term (current) drug therapy: Secondary | ICD-10-CM | POA: Insufficient documentation

## 2024-01-16 DIAGNOSIS — R002 Palpitations: Secondary | ICD-10-CM | POA: Insufficient documentation

## 2024-01-16 DIAGNOSIS — I10 Essential (primary) hypertension: Secondary | ICD-10-CM | POA: Insufficient documentation

## 2024-01-16 DIAGNOSIS — R0602 Shortness of breath: Secondary | ICD-10-CM | POA: Diagnosis not present

## 2024-01-16 DIAGNOSIS — I272 Pulmonary hypertension, unspecified: Secondary | ICD-10-CM | POA: Diagnosis not present

## 2024-01-16 LAB — PULMONARY FUNCTION TEST
DL/VA % pred: 92 %
DL/VA: 3.75 ml/min/mmHg/L
DLCO unc % pred: 88 %
DLCO unc: 16.56 ml/min/mmHg
FEF 25-75 Pre: 0.86 L/s
FEF2575-%Pred-Pre: 63 %
FEV1-%Pred-Pre: 87 %
FEV1-Pre: 1.67 L
FEV1FVC-%Pred-Pre: 88 %
FEV6-%Pred-Pre: 102 %
FEV6-Pre: 2.48 L
FEV6FVC-%Pred-Pre: 102 %
FVC-%Pred-Pre: 99 %
FVC-Pre: 2.56 L
Pre FEV1/FVC ratio: 65 %
Pre FEV6/FVC Ratio: 97 %

## 2024-01-17 ENCOUNTER — Ambulatory Visit: Payer: Self-pay | Admitting: Cardiology

## 2024-03-07 DIAGNOSIS — I2724 Chronic thromboembolic pulmonary hypertension: Secondary | ICD-10-CM | POA: Diagnosis not present

## 2024-03-07 DIAGNOSIS — I2510 Atherosclerotic heart disease of native coronary artery without angina pectoris: Secondary | ICD-10-CM | POA: Diagnosis not present

## 2024-03-07 DIAGNOSIS — M79604 Pain in right leg: Secondary | ICD-10-CM | POA: Diagnosis not present

## 2024-03-07 DIAGNOSIS — J45909 Unspecified asthma, uncomplicated: Secondary | ICD-10-CM | POA: Diagnosis not present

## 2024-03-07 DIAGNOSIS — E559 Vitamin D deficiency, unspecified: Secondary | ICD-10-CM | POA: Diagnosis not present

## 2024-03-07 DIAGNOSIS — I10 Essential (primary) hypertension: Secondary | ICD-10-CM | POA: Diagnosis not present

## 2024-04-10 DIAGNOSIS — D1801 Hemangioma of skin and subcutaneous tissue: Secondary | ICD-10-CM | POA: Diagnosis not present

## 2024-04-10 DIAGNOSIS — L814 Other melanin hyperpigmentation: Secondary | ICD-10-CM | POA: Diagnosis not present

## 2024-04-10 DIAGNOSIS — L820 Inflamed seborrheic keratosis: Secondary | ICD-10-CM | POA: Diagnosis not present

## 2024-04-10 DIAGNOSIS — L821 Other seborrheic keratosis: Secondary | ICD-10-CM | POA: Diagnosis not present

## 2024-04-10 DIAGNOSIS — L905 Scar conditions and fibrosis of skin: Secondary | ICD-10-CM | POA: Diagnosis not present

## 2024-04-10 DIAGNOSIS — L570 Actinic keratosis: Secondary | ICD-10-CM | POA: Diagnosis not present

## 2024-04-15 ENCOUNTER — Ambulatory Visit: Admitting: Pulmonary Disease

## 2024-04-15 ENCOUNTER — Encounter: Payer: Self-pay | Admitting: Pulmonary Disease

## 2024-04-15 VITALS — BP 130/80 | HR 63 | Temp 98.0°F | Ht 63.0 in | Wt 131.0 lb

## 2024-04-15 DIAGNOSIS — J4540 Moderate persistent asthma, uncomplicated: Secondary | ICD-10-CM

## 2024-04-15 DIAGNOSIS — I2729 Other secondary pulmonary hypertension: Secondary | ICD-10-CM

## 2024-04-15 NOTE — Patient Instructions (Signed)
  VISIT SUMMARY: Today, we discussed your chronic thromboembolic pulmonary hypertension, asthma, and overall exercise tolerance. Your breathing has improved, and you are maintaining an active lifestyle. We also reviewed your current medications and made plans for further evaluation and follow-up.  YOUR PLAN: CHRONIC THROMBOEMBOLIC PULMONARY HYPERTENSION: You have chronic thromboembolic pulmonary hypertension, likely due to chronic blood clots, as indicated by a VQ scan. Your lung function tests are normal, and your exercise tolerance suggests no severe strain on your heart. -We will order a home sleep study to rule out sleep apnea. -Continue taking Eliquis  as prescribed. -You will be referred to Dr. Delayne, a specialist in pulmonary hypertension, for a follow-up in three months. -If the sleep study is negative and further assessment is needed, we may consider a right heart catheterization.  ASTHMA: Your asthma is well-controlled. You have not needed the albuterol  inhaler frequently and are currently using Wixela twice daily. Your breathing is easier in cooler weather with lower humidity. -Continue using Wixela twice daily. -Use the albuterol  inhaler as needed.  BRONCHIECTASIS: You have mild inflammation in the airways consistent with bronchiectasis. Your lung function tests are normal, and there is no evidence of interstitial lung disease on imaging. -No changes to your current treatment plan are needed at this time.                             Contains text generated by [employer].

## 2024-04-15 NOTE — Progress Notes (Signed)
 She is going to follow-up in 3 months with Dr. Annella Mom Tammy Holland    [id number]    [birth year]  Primary Care Physician:Varadarajan, Valery, MD  Referring Physician: Elliot Valery, MD [address] [place],  [place] [address]  Chief complaint: Follow-up for asthma, chronic pulmonary thromboembolism, pulmonary hypertension  HPI: 83 y.o. who  has a past medical history of Arthritis, Asthma, Breast cancer (HCC), CAD (coronary artery disease), native coronary artery, Cancer (HCC) (2009), Clinically significant macular edema with cotton wool spots, Cold (within last month), Dyspnea, Hearing loss, Hemorrhoids, HLD (hyperlipidemia), Hypertension, and Personal history of radiation therapy.  Discussed the use of AI scribe software for clinical note transcription with the patient, who gave verbal consent to proceed.  History of Present Illness Tammy Holland is an 83 year old female with pulmonary hypertension and chronic thromboembolic disease who presents for evaluation of her lung condition. She was referred by her cardiologist, Dr. Shlomo, for evaluation of pulmonary hypertension and chronic thromboembolic disease.  She has a history of pulmonary hypertension diagnosed via echocardiogram in April, which led to a VQ scan showing chronic thromboembolism. A subsequent CT angiogram did not show large blood clots. She is currently on Eliquis .  ANA, rheumatoid factor, sed rate, CRP are normal.  Home sleep study and PFTs have already been ordered and are waiting for scheduling  She has a history of asthma for approximately ten years, with worsening symptoms over the past six months. She experiences increased shortness of breath, limiting her physical activities such as walking and gardening. She uses Wixela and albuterol  inhalers, with albuterol  being used up to three times a day, especially in hot, humid weather. Previously, she was on Symbicort for about two years  before switching to [place] due to insurance changes.  She has undergone a lower extremity ultrasound in the emergency room due to a foot sprain. She has a history of vein surgery and knee replacements, but recently her legs have been painful, which is a new symptom.  Interim history Tammy Holland is an 83 year old female with asthma and chronic thromboembolic pulmonary hypertension who presents for follow-up.  Dyspnea and exercise tolerance - Dyspnea occurs when walking uphill - Overall breathing has improved, particularly in cooler weather with lower humidity - Maintains an active lifestyle with daily walks and participation in exercise classes, including cardio and stretching  Asthma management - Uses albuterol  as needed, with decreased frequency recently - Uses Wixela twice daily  Chronic thromboembolic pulmonary hypertension - Diagnosis established via VQ scan - Currently on Eliquis  for anticoagulation - Lung function tests have been normal  Tobacco use - No tobacco use since the 1970s  Relevant pulmonary history Pets: Has no current pets, though she occasionally sees her grandchildren's dogs. Occupation: Used to work as a Haematologist Exposures:She resides in a retirement community and has no recent exposure to mold, hot tubs, or feather bedding. No h/o chemo/XRT/amiodarone/macrodantin/MTX  No exposure to asbestos, silica or other organic allergens  Smoking history:She smoked occasionally until the late 1970s, averaging less than two cigarettes a week. Travel history: Originally from [place] .  No significant recent travel Relevant family history:Her family history is significant for lung disease, with her mother and aunt both having bronchiectasis and emphysema, despite no exposure to cigarette smoke.   Outpatient Encounter Medications as of 04/15/2024  Medication Sig   albuterol  (PROVENTIL  HFA;VENTOLIN  HFA) 108 (  90 Base) MCG/ACT inhaler Inhale 2 puffs into the lungs every 4  (four) hours as needed for wheezing or shortness of breath.   amLODipine (NORVASC) 5 MG tablet 1 tablet Orally Once a day for 30 days   apixaban  (ELIQUIS ) 2.5 MG TABS tablet Take 1 tablet (2.5 mg total) by mouth 2 (two) times daily.   atorvastatin  (LIPITOR) 40 MG tablet Take 1 tablet (40 mg total) by mouth daily.   bisacodyl  (DULCOLAX) 5 MG EC tablet Take 5 mg by mouth daily as needed for moderate constipation.   fexofenadine (ALLEGRA) 180 MG tablet Take 180 mg by mouth daily.   fluticasone  (FLONASE ) 50 MCG/ACT nasal spray Place 1-2 sprays into both nostrils daily as needed for allergies.   hydrochlorothiazide  (MICROZIDE ) 12.5 MG capsule Take 12.5 mg by mouth daily.    metoprolol  succinate (TOPROL -XL) 25 MG 24 hr tablet Take 25 mg by mouth daily.   Misc Natural Products (OSTEO BI-FLEX TRIPLE STRENGTH) TABS Take 2 tablets by mouth daily.   Multiple Vitamin (MULTIVITAMIN) capsule Take 1 capsule by mouth daily.   Omega-3 Fatty Acids (FISH OIL) 1200 MG CAPS Take 1 capsule by mouth daily.   OVER THE COUNTER MEDICATION Biotin and collagen liquid   polyethylene glycol (MIRALAX  / GLYCOLAX ) 17 g packet Take 17 g by mouth daily as needed for moderate constipation.   sodium chloride  (OCEAN) 0.65 % SOLN nasal spray Place 1-2 sprays into both nostrils 3 (three) times daily as needed for congestion.   WIXELA INHUB 100-50 MCG/ACT AEPB Inhale 1 puff into the lungs 2 (two) times daily.   amoxicillin  (AMOXIL ) 500 MG capsule Take 1 capsule (500 mg total) by mouth 3 (three) times daily. (Patient not taking: Reported on 04/15/2024)   apixaban  (ELIQUIS ) 2.5 MG TABS tablet Take 1 tablet (2.5 mg total) by mouth 2 (two) times daily. (Patient not taking: Reported on 04/15/2024)   Artificial Tear Solution (SOOTHE XP) SOLN Place 1 drop into both eyes daily as needed (dry eyes). (Patient not taking: Reported on 04/15/2024)   aspirin  EC 81 MG tablet Take 81 mg by mouth daily. (Patient not taking: Reported on 04/15/2024)    azithromycin  (ZITHROMAX ) 250 MG tablet Take 1 tablet (250 mg total) by mouth daily. Take first 2 tablets together, then 1 every day until finished. (Patient not taking: Reported on 04/15/2024)   oxymetazoline (AFRIN) 0.05 % nasal spray Place 1 spray into both nostrils 2 (two) times daily as needed for congestion. (Patient not taking: Reported on 04/15/2024)   No facility-administered encounter medications on file as of 04/15/2024.   Vitals:   04/15/24 1332  BP: 130/80  Pulse: 63  Temp: 98 F (36.7 C)  Height: 5' 3 (1.6 m)  Weight: 131 lb (59.4 kg)  SpO2: 96%  TempSrc: Oral  BMI (Calculated): 23.21     Physical Exam GEN: No acute distress CV: Regular rate and rhythm no murmurs LUNGS: Clear to auscultation bilaterally normal respiratory effort SKIN JOINTS: Warm and dry no rash    Data Reviewed: Imaging: VQ scan 11/22/2023-multiple small peripheral perfusion defects in the bilateral lungs concerning for chronic thromboembolism CT angiogram 11/22/2023-no filling defects identified.  Small tree-in-bud reticulonodular opacity, bilateral airway thickening with mucous plugging Cardiac PET scan 12/19/2023-visualized lungs shows mild chronic bronchiectasis and peribronchial thickening of the lingula and right middle lobe. I have reviewed the images personally.  Lower extremity duplex 11/22/2023-no DVT in left lower extremity  PFTs: 01/16/2024 FVC 2.56 [99%], FEV1 1.67 [87%], F/F65, DLCO 16.56 [88%] Normal  test  Labs: Hepatic panel 09/20/2023-normal ANA, rheumatoid factor 11/17/2023-normal Sed rate, CRP 11/17/2023-normal  Cardiac: Echocardiogram 11/07/2023-LVEF 60 to 65%, normal RV systolic function, moderately elevated PA systolic pressure with estimated RVSP of 48.4 Assessment & Plan Chronic thromboembolic pulmonary hypertension Chronic thromboembolic pulmonary hypertension likely due to chronic blood clots as indicated by a VQ scan. Lung function tests are normal. Exercise tolerance  suggests no severe strain on the heart from pulmonary hypertension. - Order home sleep study to rule out sleep apnea - Continue Eliquis  - Refer to Dr. Annella, a specialist in pulmonary hypertension, for follow-up in three months - Consider right heart catheterization if sleep study is negative and further assessment is needed  Bronchiectasis Mild inflammation in the airways consistent with bronchiectasis. Lung function tests are normal. No evidence of interstitial lung disease on imaging. Smoking ceased in the 1970s.  Asthma Asthma is well-controlled. She reports not needing the albuterol  inhaler frequently and is currently using Wixela twice daily. Breathing is easier in cooler weather with lower humidity. - Continue Wixela twice daily - Use albuterol  inhaler as needed  Recommendations: Home sleep study Continue Wixela, albuterol  as needed for asthma  Lonna Coder MD [facility] 04/15/2024, 1:40 PM  CC: Elliot Charm,*

## 2024-04-23 DIAGNOSIS — Z23 Encounter for immunization: Secondary | ICD-10-CM | POA: Diagnosis not present

## 2024-05-07 ENCOUNTER — Encounter

## 2024-05-07 DIAGNOSIS — G4733 Obstructive sleep apnea (adult) (pediatric): Secondary | ICD-10-CM | POA: Diagnosis not present

## 2024-05-07 DIAGNOSIS — I2729 Other secondary pulmonary hypertension: Secondary | ICD-10-CM

## 2024-05-08 DIAGNOSIS — G4733 Obstructive sleep apnea (adult) (pediatric): Secondary | ICD-10-CM | POA: Diagnosis not present

## 2024-05-11 ENCOUNTER — Ambulatory Visit: Payer: Self-pay | Admitting: Pulmonary Disease

## 2024-06-05 ENCOUNTER — Other Ambulatory Visit: Payer: Self-pay | Admitting: Internal Medicine

## 2024-06-05 DIAGNOSIS — Z1231 Encounter for screening mammogram for malignant neoplasm of breast: Secondary | ICD-10-CM

## 2024-07-03 ENCOUNTER — Ambulatory Visit
Admission: RE | Admit: 2024-07-03 | Discharge: 2024-07-03 | Disposition: A | Discharge status: Home / Self Care | Source: Ambulatory Visit | Attending: Internal Medicine | Admitting: Internal Medicine

## 2024-07-03 DIAGNOSIS — Z1231 Encounter for screening mammogram for malignant neoplasm of breast: Secondary | ICD-10-CM | POA: Diagnosis not present

## 2024-07-10 ENCOUNTER — Ambulatory Visit: Admitting: Pulmonary Disease

## 2024-07-10 ENCOUNTER — Encounter: Payer: Self-pay | Admitting: Pulmonary Disease

## 2024-07-10 VITALS — BP 146/76 | HR 71 | Ht 63.5 in | Wt 131.4 lb

## 2024-07-10 DIAGNOSIS — J45909 Unspecified asthma, uncomplicated: Secondary | ICD-10-CM | POA: Diagnosis not present

## 2024-07-10 DIAGNOSIS — R052 Subacute cough: Secondary | ICD-10-CM

## 2024-07-10 DIAGNOSIS — I272 Pulmonary hypertension, unspecified: Secondary | ICD-10-CM | POA: Diagnosis not present

## 2024-07-10 DIAGNOSIS — G4730 Sleep apnea, unspecified: Secondary | ICD-10-CM

## 2024-07-10 MED ORDER — PREDNISONE 20 MG PO TABS: 20.0000 mg | ORAL_TABLET | Freq: Every day | ORAL | 0 refills | Status: AC

## 2024-07-10 NOTE — Progress Notes (Signed)
 @Patient  ID: Tammy Holland, female    DOB: November 04, 1940, 83 y.o.   MRN: [id number]  Chief Complaint  Patient presents with   Medical Management of Chronic Issues    Pt states no new concerns     Referring provider: Elliot Charm, MD  HPI:   83 y.o. woman whom we are seeing for evaluation of possible pulmonary hypertension.  Multiple pulmonary notes reviewed.  Multiple cardiology notes reviewed.  Here for evaluation of possible pulm hypertension.  Based on TTE 09/2023 that shows elevated PASP.  Fortunately RA size, RV size and function within normal notes.  Also demonstrated focal wall motion abnormalities as well as mitral valve regurgitation.  As part of workup she had a nuclear perfusion scan showed peripheral defect concerning for chronic PE.  She was placed on anticoagulation, Eliquis  at that time.  As part of workup she is obtained a sleep study showed mild sleep apnea.  She is not interested in CPAP therapy.  She had PFT 10/2023 that demonstrates normal spirometry and normal DLCO which is encouraging.  She had cross-sectional imaging with CT cardiac perfusion scan that showed some new lateral mild bronchiectasis, bronchial wall thickening.  She carries a diagnosis of asthma.  She has some dyspnea with inclines, stairs.  She walks 4 miles daily albeit broken up.  No 20 minute per mile pace.  She had a cough for about the last month.  Out of the norm for her.  Productive of mucus.  Feels like congestion in her chest.    Questionaires / Pulmonary Flowsheets:   ACT:  Asthma Control Test ACT Total Score  01/03/2024  2:42 PM 15    MMRC:     No data to display          Epworth:      No data to display          Tests:   FENO:  No results found for: NITRICOXIDE  PFT:    Latest Ref Rng & Units 01/16/2024    1:14 PM  PFT Results  FVC-Pre L 2.56   FVC-Predicted Pre % 99   Pre FEV1/FVC % % 65   FEV1-Pre L 1.67   FEV1-Predicted Pre % 87   DLCO uncorrected  ml/min/mmHg 16.56   DLCO UNC% % 88   DLVA Predicted % 92   Personally viewed interpret this normal spirometry, DLCO within normal limits  WALK:      No data to display          Imaging: Personally reviewed MM 3D SCREENING MAMMOGRAM BILATERAL BREAST Result Date: 07/09/2024 CLINICAL DATA:  Screening. EXAM: DIGITAL SCREENING BILATERAL MAMMOGRAM WITH TOMOSYNTHESIS AND CAD TECHNIQUE: Bilateral screening digital craniocaudal and mediolateral oblique mammograms were obtained. Bilateral screening digital breast tomosynthesis was performed. The images were evaluated with computer-aided detection. COMPARISON:  Previous exam(s). ACR Breast Density Category b: There are scattered areas of fibroglandular density. FINDINGS: There are no findings suspicious for malignancy. IMPRESSION: No mammographic evidence of malignancy. A result letter of this screening mammogram will be mailed directly to the patient. RECOMMENDATION: Screening mammogram in one year. (Code:SM-B-01Y) BI-RADS CATEGORY  1: Negative. Electronically Signed   By: Alm Parkins M.D.   On: 07/09/2024 12:54    Lab Results: Personally reviewed CBC    Component Value Date/Time   WBC 9.7 11/22/2023 1759   RBC 4.06 11/22/2023 1759   HGB 11.5 (L) 11/22/2023 1759   HCT 34.5 (L) 11/22/2023 1759   PLT 253 11/22/2023 1759  MCV 85.0 11/22/2023 1759   MCH 28.3 11/22/2023 1759   MCHC 33.3 11/22/2023 1759   RDW 16.7 (H) 11/22/2023 1759   LYMPHSABS 1.0 12/14/2007 1430   MONOABS 0.5 12/14/2007 1430   EOSABS 0.0 12/14/2007 1430   BASOSABS 0.0 12/14/2007 1430    BMET    Component Value Date/Time   NA 137 11/22/2023 1759   NA 138 05/25/2023 1003   K 4.1 11/22/2023 1759   CL 103 11/22/2023 1759   CO2 23 11/22/2023 1759   GLUCOSE 90 11/22/2023 1759   BUN 25 (H) 11/22/2023 1759   BUN 18 05/25/2023 1003   CREATININE 0.88 11/22/2023 1759   CALCIUM  9.7 11/22/2023 1759   GFRNONAA >60 11/22/2023 1759   GFRAA 89 12/05/2019 1448    BNP No  results found for: BNP  ProBNP No results found for: PROBNP  Specialty Problems   None   Allergies[1]  Immunization History  Administered Date(s) Administered   Moderna Covid-19 Fall Seasonal Vaccine 89yrs & older 04/23/2024   Moderna Covid-19 Vaccine  Bivalent Booster 38yrs & up 06/25/2021   Moderna SARS-COV2 Booster Vaccination 01/01/2021    Past Medical History:  Diagnosis Date   Arthritis    oa   Asthma    Breast cancer (HCC)    CAD (coronary artery disease), native coronary artery    coronary CTA 2021 with 25-49% mLAD stenosis and normal FFR   Cancer (HCC) 2009   BREAST left partial mastectomy and radiation   Clinically significant macular edema with cotton wool spots    takes preservision for   Cold within last month   spitting up clear mucous now, no fevers   Dyspnea    with exertion only   Hearing loss    both ears   Hemorrhoids    HLD (hyperlipidemia)    LDL goal < 70   Hypertension    Personal history of radiation therapy     Tobacco History: Tobacco Use History[2] Counseling given: Not Answered Tobacco comments: social smoking only   Continue to not smoke  Outpatient Encounter Medications as of 07/10/2024  Medication Sig   albuterol  (PROVENTIL  HFA;VENTOLIN  HFA) 108 (90 Base) MCG/ACT inhaler Inhale 2 puffs into the lungs every 4 (four) hours as needed for wheezing or shortness of breath.   amLODipine (NORVASC) 5 MG tablet 1 tablet Orally Once a day for 30 days   amoxicillin  (AMOXIL ) 500 MG capsule Take 1 capsule (500 mg total) by mouth 3 (three) times daily.   apixaban  (ELIQUIS ) 2.5 MG TABS tablet Take 1 tablet (2.5 mg total) by mouth 2 (two) times daily.   apixaban  (ELIQUIS ) 2.5 MG TABS tablet Take 1 tablet (2.5 mg total) by mouth 2 (two) times daily.   Artificial Tear Solution (SOOTHE XP) SOLN Place 1 drop into both eyes daily as needed (dry eyes).   aspirin  EC 81 MG tablet Take 81 mg by mouth daily.   atorvastatin  (LIPITOR) 40 MG tablet Take  1 tablet (40 mg total) by mouth daily.   azithromycin  (ZITHROMAX ) 250 MG tablet Take 1 tablet (250 mg total) by mouth daily. Take first 2 tablets together, then 1 every day until finished.   bisacodyl  (DULCOLAX) 5 MG EC tablet Take 5 mg by mouth daily as needed for moderate constipation.   fexofenadine (ALLEGRA) 180 MG tablet Take 180 mg by mouth daily.   fluticasone  (FLONASE ) 50 MCG/ACT nasal spray Place 1-2 sprays into both nostrils daily as needed for allergies.   hydrochlorothiazide  (MICROZIDE ) 12.5 MG capsule  Take 12.5 mg by mouth daily.    metoprolol  succinate (TOPROL -XL) 25 MG 24 hr tablet Take 25 mg by mouth daily.   Misc Natural Products (OSTEO BI-FLEX TRIPLE STRENGTH) TABS Take 2 tablets by mouth daily.   Multiple Vitamin (MULTIVITAMIN) capsule Take 1 capsule by mouth daily.   Omega-3 Fatty Acids (FISH OIL) 1200 MG CAPS Take 1 capsule by mouth daily.   OVER THE COUNTER MEDICATION Biotin and collagen liquid   oxymetazoline (AFRIN) 0.05 % nasal spray Place 1 spray into both nostrils 2 (two) times daily as needed for congestion.   polyethylene glycol (MIRALAX  / GLYCOLAX ) 17 g packet Take 17 g by mouth daily as needed for moderate constipation.   sodium chloride  (OCEAN) 0.65 % SOLN nasal spray Place 1-2 sprays into both nostrils 3 (three) times daily as needed for congestion.   WIXELA INHUB 100-50 MCG/ACT AEPB Inhale 1 puff into the lungs 2 (two) times daily.   No facility-administered encounter medications on file as of 07/10/2024.     Review of Systems  Review of Systems  No chest pain exertion.  Comprehensive review of systems otherwise negative. Physical Exam  BP (!) 146/76   Pulse 71   Ht 5' 3.5 (1.613 m) Comment: per pt  Wt 131 lb 6.4 oz (59.6 kg)   SpO2 94%   BMI 22.91 kg/m   Wt Readings from Last 5 Encounters:  07/10/24 131 lb 6.4 oz (59.6 kg)  04/15/24 131 lb (59.4 kg)  01/03/24 133 lb (60.3 kg)  10/03/23 130 lb 9.6 oz (59.2 kg)  09/20/23 129 lb (58.5 kg)     BMI Readings from Last 5 Encounters:  07/10/24 22.91 kg/m  04/15/24 23.21 kg/m  01/03/24 23.56 kg/m  10/03/23 22.42 kg/m  09/20/23 22.85 kg/m     Physical Exam General: Sitting in chair, no distress Eyes: EOMI Pulmonary: Good air excursion, normal work of breathing, clear Cardiovascular regular rate and rhythm, no murmur, no edema noted   Assessment & Plan:   Presumed pulmonary hypertension: Based on TTE with elevated PASP.  Fortuner normal RA size, normal RV size and function.  Interestingly, she has regional wall motion abnormalities which are not totally accounted for.  She also has mild MVR.  She had a nuclear medicine perfusion scan that showed peripheral defect concerning for blood clots.  She had a sleep study that demonstrated mild sleep apnea.  Most likely group 2 and group 4 disease.  Group 2 disease considered but given how mild sleep apnea is a seems less likely.  She is not interested in CPAP therapy.  We can repeat a TTE now, 6+ months from starting blood thinners for presumed chronic PE.  It is possible is more of acute PE has been treated and now improved although this seems unlikely.  If RV dysfunction or RA abnormalities are noted or if PASP or RVSP remains elevated, recommended right heart catheterization to see if we can formally diagnose pulmonary hypertension and if would be a candidate for therapy.  DLCO within normal limits argues against significant pulmonary vascular disease.  Asthma: Previous diagnosis.  Continue Wixela.  Cough: Subacute in nature, 1 month, productive.  After cold.  Prednisone  20 mg day for 5 days sent.  Suspect signs of mild asthma exacerbation.  She has mild bronchiectasis with prior cross-sectional imaging but would expect this to be more consistent and persistent as opposed to subacute.  Mild OSA: Not interested in CPAP therapy.  Encouraged good sleep hygiene.   Return  in about 1 month (around 08/10/2024) for f/u Dr.  Annella.   Tammy JONELLE Annella, MD 07/10/2024   This appointment required 41 minutes of patient care (this includes precharting, chart review, review of results, face-to-face care, etc.).     [1]  Allergies Allergen Reactions   Turmeric Other (See Comments)    Other Reaction(s): cant tolerate  [2]  Social History Tobacco Use  Smoking Status Former   Current packs/day: 0.00   Types: Cigarettes   Start date: 07/25/1946   Quit date: 07/25/1976   Years since quitting: 47.9  Smokeless Tobacco Never  Tobacco Comments   social smoking only

## 2024-08-21 ENCOUNTER — Ambulatory Visit (HOSPITAL_COMMUNITY)
Admission: RE | Admit: 2024-08-21 | Discharge: 2024-08-21 | Disposition: A | Discharge status: Home / Self Care | Source: Ambulatory Visit | Attending: Cardiovascular Disease | Admitting: Cardiovascular Disease

## 2024-08-21 DIAGNOSIS — I272 Pulmonary hypertension, unspecified: Secondary | ICD-10-CM | POA: Insufficient documentation

## 2024-08-21 LAB — ECHOCARDIOGRAM COMPLETE
Area-P 1/2: 2.41 cm2
S' Lateral: 2.3 cm

## 2024-09-17 ENCOUNTER — Encounter: Admitting: Pulmonary Disease

## 2024-11-11 ENCOUNTER — Ambulatory Visit: Admitting: Cardiology
